# Patient Record
Sex: Male | Born: 1960 | Race: Black or African American | Hispanic: No | Marital: Married | Smoking: Never smoker
Health system: Southern US, Community
[De-identification: ages and names within clinical notes are randomized; demographics above are authoritative.]

## PROBLEM LIST (undated history)

## (undated) HISTORY — PX: CARPAL TUNNEL RELEASE: SHX101

## (undated) HISTORY — PX: TENDON REPAIR: SHX5111

---

## 2004-12-14 ENCOUNTER — Encounter: Admission: RE | Admit: 2004-12-14 | Discharge: 2004-12-14 | Payer: Self-pay | Admitting: Emergency Medicine

## 2004-12-20 ENCOUNTER — Ambulatory Visit (HOSPITAL_BASED_OUTPATIENT_CLINIC_OR_DEPARTMENT_OTHER): Admission: RE | Admit: 2004-12-20 | Discharge: 2004-12-20 | Payer: Self-pay | Admitting: Orthopedic Surgery

## 2008-02-25 ENCOUNTER — Ambulatory Visit (HOSPITAL_BASED_OUTPATIENT_CLINIC_OR_DEPARTMENT_OTHER): Admission: RE | Admit: 2008-02-25 | Discharge: 2008-02-25 | Payer: Self-pay | Admitting: Emergency Medicine

## 2008-03-01 ENCOUNTER — Ambulatory Visit: Payer: Self-pay | Admitting: Internal Medicine

## 2008-09-16 ENCOUNTER — Emergency Department (HOSPITAL_COMMUNITY): Admission: EM | Admit: 2008-09-16 | Discharge: 2008-09-16 | Payer: Self-pay | Admitting: Emergency Medicine

## 2009-03-09 ENCOUNTER — Encounter: Payer: Self-pay | Admitting: *Deleted

## 2010-12-06 LAB — COMPREHENSIVE METABOLIC PANEL
Albumin: 3.6 g/dL (ref 3.5–5.2)
Alkaline Phosphatase: 39 U/L (ref 39–117)
Calcium: 9.1 mg/dL (ref 8.4–10.5)
Chloride: 105 mEq/L (ref 96–112)
GFR calc Af Amer: >60 mL/min (ref >60)
GFR calc non Af Amer: >60 mL/min (ref >60)
Potassium: 3.4 mEq/L — ABNORMAL LOW (ref 3.5–5.1)
Sodium: 139 mEq/L (ref 135–145)
Total Protein: 6.3 g/dL (ref 6.0–8.3)

## 2010-12-06 LAB — CBC
HCT: 40.8 % (ref 39.0–52.0)
MCV: 84.6 fL (ref 78.0–100.0)

## 2010-12-06 LAB — D-DIMER, QUANTITATIVE: D-Dimer, Quant: <0.22 ug/mL-FEU (ref 0.00–0.48)

## 2010-12-06 LAB — POCT CARDIAC MARKERS
Myoglobin, poc: 103 ng/mL (ref 12–200)
Troponin i, poc: <0.05 ng/mL (ref 0.00–0.09)

## 2010-12-06 LAB — DIFFERENTIAL
Lymphs Abs: 1.7 10*3/uL (ref 0.7–4.0)
Monocytes Relative: 13 % — ABNORMAL HIGH (ref 3–12)
Neutro Abs: 2.4 10*3/uL (ref 1.7–7.7)

## 2011-01-04 NOTE — Procedures (Signed)
Juan Golden, Juan Golden                  ACCOUNT NO.:  [id number]   MEDICAL RECORD NO.:  [id number]          PATIENT TYPE:  OUT   LOCATION:  SLEEP CENTER                 FACILITY:  [facility]   PHYSICIAN:  Clinton D. Maple Hudson, MD, FCCP, FACPDATE OF BIRTH:  1960[birth year]04/25   DATE OF STUDY:  02/25/2008                            NOCTURNAL POLYSOMNOGRAM   REFERRING PHYSICIAN:  Reuben Likes, M.D.   INDICATION FOR STUDY:  Hypersomnia with sleep apnea.   EPWORTH SLEEPINESS SCORE:  13/24.  BMI 30, weight 212 pounds, height 71  inches, neck 16 inches.   MEDICATIONS:  None listed.   SLEEP ARCHITECTURE:  Total sleep time 328.5 minutes with sleep  deficiency 93.2%.  Stage I was 4.4%, stage II 76.4%, stage III absent,  REM 19.2% of total sleep time.  Sleep latency 11 minutes, REM latency 55  minutes, awake after sleep onset 14 minutes, arousal index 10.8.  No  bedtime medication taken.  Sleep architecture was unremarkable.   RESPIRATORY DATA:  Apnea/hypopnea index (AHI), 2.7 per hour.  Respiratory disturbance index (RDI), 7.1 per hour.  Fifteen events were  scored, including 1 obstructive apnea and 14 hypopneas.  Events were not  positional.  REM AHI 8.6.  There were insufficient events to permit CPAP  titration by split protocol on this study night.   OXYGEN DATA:  Mild to moderate snoring with oxygen desaturation to a  nadir of 91%.  Mean oxygen saturation through the study was 96.2% on  room air.   CARDIAC DATA:  Normal sinus rhythm.   MOVEMENT-PARASOMNIA:  No limb jerks disturbing sleep.  Bathroom x1.   IMPRESSIONS-RECOMMENDATIONS:  1. Unremarkable sleep architecture with total sleep time of 328      minutes.  Note, that he considered this a poor sleep night.  Sleep      latency was 11 minutes.  He estimated it took him an hour to fall      asleep.  He considered sleep restless, worse than usual and      estimated that he slept only 2 hours.  Total sleep time by EEG was      328.5 minutes,  indicating that there is some sleep state      misperception on his part and reassurance may be appropriate.  2. Occasional respiratory events disturbing sleep.  Minimal criteria      for sleep apnea syndrome based on RDI of 7.1 per hour.  AHI was      within normal range of 2.7 per hour, (normal range 0-5 per hour).      Mild to moderate snoring with nonpositional events and oxygen      desaturation to a nadir of 91%.  3. Scores in this range would not usually be addressed with CPAP      therapy unless clinical circumstances indicate otherwise.  It may      help to lose some weight, to treat for upper airway obstruction or      congestion if significant.      Clinton D. Maple Hudson, MD, FCCP, FACP  Diplomate, Biomedical engineer of Sleep Medicine  Electronically Signed  CDY/MEDQ  D:  03/01/2008 08:48:22  T:  03/01/2008 09:08:02  Job:  [id number]

## 2011-01-07 NOTE — Op Note (Signed)
NAMEALTIN, SEASE                  ACCOUNT NO.:  [id number]   MEDICAL RECORD NO.:  [id number]          PATIENT TYPE:  AMB   LOCATION:  DSC                          FACILITY:  [facility]   PHYSICIAN:  Feliberto Gottron. Turner Daniels, M.D.   DATE OF BIRTH:  [birth year]   DATE OF PROCEDURE:  12/20/2004  DATE OF DISCHARGE:                                 OPERATIVE REPORT   PREOPERATIVE DIAGNOSIS:  Right distal biceps avulsion.   POSTOPERATIVE DIAGNOSIS:  Right distal biceps avulsion.   PROCEDURE:  Right distal biceps repair using an 8 x 15 mm Arthrex bio-  tenodesis screw.   SURGEON:  Feliberto Gottron. Turner Daniels, M.D.   FIRST ASSISTANT:  Erskine Squibb B. Jannet Mantis.   ANESTHETIC:  General endotracheal.   ESTIMATED BLOOD LOSS:  Minimal.   FLUID REPLACEMENT:  800 mL of crystalloid.   DRAINS PLACED:  None.   TOURNIQUET TIME:  45 minutes.   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  The risks and benefits of surgery well-  understood by the patient.   DESCRIPTION OF PROCEDURE:  The patient identified by armband, taken the  operating room at [facility] day surgery center.  Appropriate anesthetic monitors  were attached, general endotracheal anesthesia induced with the patient in  the supine position.  A tourniquet was applied high the right upper  extremity, which was then prepped and draped in the usual sterile fashion  from the fingertips to the tourniquet.  We then made a hockey stick-shaped  incision starting out just distal to the elbow flexion crease, just to the  medial of midline, and then going laterally about a 1.5 cm lateral to the  midline and going distally for about 3 cm through the skin and subcutaneous  tissue.  Using manual palpation, we were then able to reach up underneath  the skin proximally  and identify and retrieve the biceps tendon, which had  been avulsed off.  We trimmed the ends and then using a #2 FiberWire we made  whipstitch up and down the tendon and wrapped it with a moist towel.  We  then manually explored the tract of the biceps tendon back down to the  bicipital tuberosity and using baby Hohmann and cobra retractors were able  to expose the bicipital tuberosity with the hand in full supination.  With  the elbow flexed 30 degrees, we then used the Arthrex guidewire into the  biceps tuberosity and overreamed it with the 8 mm reamer to a depth the 15  mm, not violating the distal cortex.  We then used the whipstitch, going  through the tendon, and brought it up through the bio-screw screwdriver,  allowing [place] to bring the tendon past the bio-screw as we inserted it into  the socket.  We then screwed the bio-screw down, getting good compression of  the tendon against the wall of  the socket and then the using the two limbs  of the #2 FiberWire tied it over the screw, which was fully seated, for  supplemental fixation.  Once this had been accomplished, the biceps tendon  was noted be firmly fixed to the bone.  The hand could be supinated and  pronated without difficulty.  He could even use the tendon to flex the elbow  against gravity because of the firmness of the fixation.  At this point the  tourniquet was let down and the wound was irrigated out with normal saline  solution.  Small bleeders were identified and cauterized.  The subcutaneous tissue was  closed with running 3-0 Vicryl suture and the skin with running interlocking  4-0 nylon suture.  A dressing of Xeroform, 4x4 dressing sponges, Webril and  an Ace wrap applied.  The patient was then placed in a sling, awakened and  taken to the recovery room without difficulty.      FJR/MEDQ  D:  12/20/2004  T:  12/20/2004  Job:  [id number]

## 2011-12-08 ENCOUNTER — Encounter: Payer: Self-pay | Admitting: *Deleted

## 2013-05-29 ENCOUNTER — Encounter: Payer: Self-pay | Admitting: *Deleted

## 2013-05-29 ENCOUNTER — Encounter: Payer: Self-pay | Admitting: Cardiovascular Disease

## 2013-11-17 ENCOUNTER — Other Ambulatory Visit: Payer: Self-pay | Admitting: *Deleted

## 2013-11-17 ENCOUNTER — Ambulatory Visit: Payer: BC Managed Care – PPO

## 2013-11-17 ENCOUNTER — Ambulatory Visit (INDEPENDENT_AMBULATORY_CARE_PROVIDER_SITE_OTHER): Payer: BC Managed Care – PPO | Admitting: Emergency Medicine

## 2013-11-17 ENCOUNTER — Ambulatory Visit (HOSPITAL_COMMUNITY)
Admission: RE | Admit: 2013-11-17 | Discharge: 2013-11-17 | Disposition: A | Discharge status: Home / Self Care | Payer: BC Managed Care – PPO | Source: Ambulatory Visit | Attending: Emergency Medicine | Admitting: Emergency Medicine

## 2013-11-17 VITALS — BP 108/78 | HR 66 | Temp 97.8°F | Resp 16 | Ht 71.0 in | Wt 209.0 lb

## 2013-11-17 DIAGNOSIS — R109 Unspecified abdominal pain: Secondary | ICD-10-CM

## 2013-11-17 DIAGNOSIS — R110 Nausea: Secondary | ICD-10-CM

## 2013-11-17 DIAGNOSIS — R1110 Vomiting, unspecified: Secondary | ICD-10-CM | POA: Insufficient documentation

## 2013-11-17 DIAGNOSIS — K7689 Other specified diseases of liver: Secondary | ICD-10-CM | POA: Insufficient documentation

## 2013-11-17 DIAGNOSIS — R1012 Left upper quadrant pain: Secondary | ICD-10-CM | POA: Insufficient documentation

## 2013-11-17 LAB — POCT CBC
Granulocyte percent: 71.5 %G (ref 37–80)
HCT, POC: 44.8 % (ref 43.5–53.7)
HEMOGLOBIN: 14.9 g/dL (ref 14.1–18.1)
Lymph, poc: 1.3 (ref 0.6–3.4)
MCH: 27.6 pg (ref 27–31.2)
MCHC: 33.3 g/dL (ref 31.8–35.4)
MCV: 83 fL (ref 80–97)
MID (CBC): 0.4 (ref 0–0.9)
MPV: 9.5 fL (ref 0–99.8)
PLATELET COUNT, POC: 230 10*3/uL (ref 142–424)
POC Granulocyte: 4.1 (ref 2–6.9)
POC LYMPH PERCENT: 21.6 %L (ref 10–50)
POC MID %: 6.9 %M (ref 0–12)
RBC: 5.4 M/uL (ref 4.69–6.13)
RDW, POC: 14.7 %
WBC: 5.8 10*3/uL (ref 4.6–10.2)

## 2013-11-17 LAB — POCT URINALYSIS DIPSTICK
BILIRUBIN UA: NEGATIVE
GLUCOSE UA: NEGATIVE
Ketones, UA: NEGATIVE
LEUKOCYTES UA: NEGATIVE
Nitrite, UA: NEGATIVE
Protein, UA: NEGATIVE
Spec Grav, UA: 1.02
UROBILINOGEN UA: 0.2
pH, UA: 5.5

## 2013-11-17 LAB — COMPREHENSIVE METABOLIC PANEL
ALBUMIN: 4.2 g/dL (ref 3.5–5.2)
ALT: 19 U/L (ref 0–53)
AST: 22 U/L (ref 0–37)
Alkaline Phosphatase: 52 U/L (ref 39–117)
BUN: 14 mg/dL (ref 6–23)
CALCIUM: 9.2 mg/dL (ref 8.4–10.5)
CHLORIDE: 106 meq/L (ref 96–112)
CO2: 23 meq/L (ref 19–32)
Creat: 1.19 mg/dL (ref 0.50–1.35)
GLUCOSE: 78 mg/dL (ref 70–99)
Potassium: 4.2 mEq/L (ref 3.5–5.3)
Sodium: 140 mEq/L (ref 135–145)
TOTAL PROTEIN: 6.5 g/dL (ref 6.0–8.3)
Total Bilirubin: 0.8 mg/dL (ref 0.2–1.2)

## 2013-11-17 LAB — IFOBT (OCCULT BLOOD): IMMUNOLOGICAL FECAL OCCULT BLOOD TEST: NEGATIVE

## 2013-11-17 LAB — AMYLASE: Amylase: 42 U/L (ref 0–105)

## 2013-11-17 LAB — POCT UA - MICROSCOPIC ONLY
Bacteria, U Microscopic: NEGATIVE
CASTS, UR, LPF, POC: NEGATIVE
Crystals, Ur, HPF, POC: NEGATIVE
EPITHELIAL CELLS, URINE PER MICROSCOPY: NEGATIVE
YEAST UA: NEGATIVE

## 2013-11-17 MED ORDER — IOHEXOL 300 MG/ML  SOLN
100.0000 mL | Freq: Once | INTRAMUSCULAR | Status: AC | PRN
  Administered 2013-11-17: 100 mL via INTRAVENOUS

## 2013-11-17 MED ORDER — CIPROFLOXACIN HCL 500 MG PO TABS: 500.0000 mg | ORAL_TABLET | Freq: Two times a day (BID) | ORAL | Status: DC

## 2013-11-17 MED ORDER — IOHEXOL 300 MG/ML  SOLN
50.0000 mL | Freq: Once | INTRAMUSCULAR | Status: AC | PRN
  Administered 2013-11-17: 50 mL via ORAL

## 2013-11-17 NOTE — Progress Notes (Addendum)
Subjective:  This chart was scribed for Juan ChrisSteven Young Mulvey, MD by Carl Bestelina Holson, Medical Scribe. This patient was seen in Room 8 and the patient's care was started at 9:09 AM.   Patient ID: Juan KayEarl J Streat Jr., male    DOB: [birth year], 53 y.o.   MRN: [id number]  HPI HPI Comments: Juan Kayarl J Hintze Jr. is a 53 y.o. male who [facility] complaining of intermittent left upper sided abdominal pain that started at 5 PM yesterday.  The patient states that the pain would last for 15 minutes and go away.  He states that he had about 4 episodes of this abdominal pain last night and each time it got worse.  He states that he vomited during the second episode of this abdominal pain.  He states that his last episode of pain was at 8 -9 PM last night.  The patient states that last night his pain was at a 15/10 and felt as though he had been hit in the testicles.  He states that he rates the pain at a 3-4/10 currently.  He denies constipation, groin pain, testicular pain, and back pain as associated symptoms.  He states that he urinated regularly early this morning and had a small bowel movement.  He states that he went to [facility] yesterday but left without being seen because he was starting to feel better.  He states that today the pain is not as bad but is still noticeable.  He states that he has not eaten anything but states that he is hungry.  He denies having a history of kidney stones.  He states that his last colonoscopy was in January of 2014.  He denies having any major health problems.    Review of Systems  Gastrointestinal: Positive for vomiting and abdominal pain. Negative for constipation.  Genitourinary: Negative for testicular pain.  Musculoskeletal: Negative for back pain.  All other systems reviewed and are negative.     Objective:  Physical Exam Physical Exam  Nursing note and vitals reviewed. Constitutional: He is oriented to person, place, and time.  He appears well-developed and well-nourished.  HENT:  Head: Normocephalic and atraumatic.  Eyes: EOM are normal.  Neck: Normal range of motion.  Cardiovascular: Normal rate.   Pulmonary/Chest: Effort normal.  Musculoskeletal: Normal range of motion.  Neurological: He is alert and oriented to person, place, and time.  Skin: Skin is warm and dry.  Psychiatric: He has a normal mood and affect. His behavior is normal.  ABD bowel sounds are normal. I could not elicit any abdominal tenderness on examination. His abdomen is not distended. There is not a palpable hernia. His testicles are normal. Prostate is normal size there is stool in the rectal vault.     Results for orders placed in visit on 11/17/13  POCT CBC      Result Value Ref Range   WBC 5.8  4.6 - 10.2 K/uL   Lymph, poc 1.3  0.6 - 3.4   POC LYMPH PERCENT 21.6  10 - 50 %L   MID (cbc) 0.4  0 - 0.9   POC MID % 6.9  0 - 12 %M   POC Granulocyte 4.1  2 - 6.9   Granulocyte percent 71.5  37 - 80 %G   RBC 5.40  4.69 - 6.13 M/uL   Hemoglobin 14.9  14.1 - 18.1 g/dL   HCT, POC 86.544.8  78.443.5 - 53.7 %   MCV  83.0  80 - 97 fL   MCH, POC 27.6  27 - 31.2 pg   MCHC 33.3  31.8 - 35.4 g/dL   RDW, POC 16.1     Platelet Count, POC 230  142 - 424 K/uL   MPV 9.5  0 - 99.8 fL   [facility] reading (PRIMARY) by  Dr. Cleta Alberts there is a significant amount of stool in the colon. There is no free air there is no evidence of obstruction Results for orders placed in visit on 11/17/13  POCT CBC      Result Value Ref Range   WBC 5.8  4.6 - 10.2 K/uL   Lymph, poc 1.3  0.6 - 3.4   POC LYMPH PERCENT 21.6  10 - 50 %L   MID (cbc) 0.4  0 - 0.9   POC MID % 6.9  0 - 12 %M   POC Granulocyte 4.1  2 - 6.9   Granulocyte percent 71.5  37 - 80 %G   RBC 5.40  4.69 - 6.13 M/uL   Hemoglobin 14.9  14.1 - 18.1 g/dL   HCT, POC 09.6  04.5 - 53.7 %   MCV 83.0  80 - 97 fL   MCH, POC 27.6  27 - 31.2 pg   MCHC 33.3  31.8 - 35.4 g/dL   RDW, POC 40.9     Platelet Count, POC 230  142 -  424 K/uL   MPV 9.5  0 - 99.8 fL  POCT URINALYSIS DIPSTICK      Result Value Ref Range   Color, UA dk yellow     Clarity, UA clear     Glucose, UA neg     Bilirubin, UA neg     Ketones, UA neg     Spec Grav, UA 1.020     Blood, UA trace-lysed     pH, UA 5.5     Protein, UA neg     Urobilinogen, UA 0.2     Nitrite, UA neg     Leukocytes, UA Negative    POCT UA - MICROSCOPIC ONLY      Result Value Ref Range   WBC, Ur, HPF, POC 0-1     RBC, urine, microscopic 0-2     Bacteria, U Microscopic neg     Mucus, UA trace     Epithelial cells, urine per micros neg     Crystals, Ur, HPF, POC neg     Casts, Ur, LPF, POC neg     Yeast, UA neg    IFOBT (OCCULT BLOOD)      Result Value Ref Range   IFOBT Negative      BP 108/78  Pulse 66  Temp(Src) 97.8 F (36.6 C)  Resp 16  Ht 5\' 11"  (1.803 m)  Wt 209 lb (94.802 kg)  BMI 29.16 kg/m2  SpO2 98% Assessment & Plan:  This may all be constipation related. Unclear at the present time. Last night he said his pain level was 15 out of 10. We'll proceed with a CT abdomen and pelvis to be sure we are not missing anything. CT shows a probable hemangioma in the liver as well as inflammation of the sigmoid colon. This was felt to possibly represent a colitis. We'll treat with Cipro 500 twice a day for 10 days. Will await liver function tests.  I personally performed the services described in this documentation, which was scribed in my presence. The recorded information has been reviewed and is accurate.

## 2013-11-17 NOTE — Patient Instructions (Signed)
You are to go to [facility] and register as an outpatient for your CT scan

## 2013-11-18 ENCOUNTER — Telehealth: Payer: Self-pay

## 2013-11-18 NOTE — Telephone Encounter (Signed)
Pt notified. See labs 

## 2013-11-18 NOTE — Telephone Encounter (Signed)
Patient is returning our phone call regarding his results.   Best#: [phone]

## 2014-03-19 ENCOUNTER — Telehealth: Payer: Self-pay

## 2014-03-19 NOTE — Telephone Encounter (Signed)
Juan Golden from [facility] left message on Tuesday at 2:27pm asking for a call back regarding patient. Did not leave any details. Returned call today and left message at [phone].

## 2015-02-05 ENCOUNTER — Telehealth: Payer: Self-pay | Admitting: Internal Medicine

## 2015-02-05 NOTE — Telephone Encounter (Signed)
Received records from [facility] for appointment with Dr Rennis Golden on 03/31/15.  Records given to [facility] (medical records) for Dr [facility] schedule on 03/31/15. lp

## 2015-03-31 ENCOUNTER — Ambulatory Visit: Payer: BC Managed Care – PPO | Admitting: Internal Medicine

## 2019-04-01 ENCOUNTER — Encounter (HOSPITAL_COMMUNITY): Payer: Self-pay | Admitting: Emergency Medicine

## 2019-04-01 ENCOUNTER — Ambulatory Visit (HOSPITAL_COMMUNITY)
Admission: EM | Admit: 2019-04-01 | Discharge: 2019-04-01 | Disposition: A | Discharge status: Home / Self Care | Payer: BC Managed Care – PPO | Attending: Family Medicine | Admitting: Family Medicine

## 2019-04-01 ENCOUNTER — Other Ambulatory Visit: Payer: Self-pay

## 2019-04-01 DIAGNOSIS — R079 Chest pain, unspecified: Secondary | ICD-10-CM

## 2019-04-01 NOTE — ED Provider Notes (Signed)
[facility]    CSN: [id number] Arrival date & time: 04/01/19  1631      History   Chief Complaint Chief Complaint  Patient presents with  . Chest Pain    HPI Juan Kayarl J Warchol Jr. is a 58 y.o. male.   HPI  Patient is here for chest pain.  Is been bothering for the last 3 nights.  He does not have chest pain during the day.  It is always in the middle of the night he will wake up with a pressure sensation in the middle of his chest.  He states when he has this he feels like he cannot take a deep breath.  He has had chest pain in the past and had a stress test about 5 years ago.  He is never had any heart disease.  He does not have hypertension.  He is on atorvastatin for Lipitor.  There is no family history of heart disease.  He does not have diabetes, cigarette smoking, obesity, or sedentary lifestyle.  He does have a history of GERD.  This feels different than his GERD.  It is not related to meals.  He states he is undergoing some stress right now.  He did not have any trauma or chest wall pain.  No history of underlying lung disease The chest pain does not cause any dizziness been it.  No nausea or vomiting.  No radiation of pain.  No palpitations.  He states it does make him moderately anxious  Past Medical History:  Diagnosis Date  . Chest pain     There are no active problems to display for this patient.   Past Surgical History:  Procedure Laterality Date  . CARPAL TUNNEL RELEASE     left  . TENDON REPAIR     right arm       Home Medications    Prior to Admission medications   Medication Sig Start Date End Date Taking? Authorizing Provider  atorvastatin (LIPITOR) 20 MG tablet Take 20 mg by mouth daily.   Yes [provider]  Ibuprofen (ADVIL) 200 MG CAPS Take by mouth as needed.    [provider]    Family History Family History  Problem Relation Age of Onset  . Hypertension Mother   . Hypertension Father   . Diabetes Father   .  Dementia Other   . Diabetes Other     Social History Social History   Tobacco Use  . Smoking status: Never Smoker  Substance Use Topics  . Alcohol use: No  . Drug use: Not on file     Allergies   Patient has no known allergies.   Review of Systems Review of Systems  Constitutional: Negative for chills and fever.  HENT: Negative for ear pain and sore throat.   Eyes: Negative for pain and visual disturbance.  Respiratory: Negative for cough and shortness of breath.   Cardiovascular: Positive for chest pain. Negative for palpitations.  Gastrointestinal: Negative for abdominal pain and vomiting.  Genitourinary: Negative for dysuria and hematuria.  Musculoskeletal: Negative for arthralgias and back pain.  Skin: Negative for color change and rash.  Neurological: Negative for seizures and syncope.  All other systems reviewed and are negative.    Physical Exam Triage Vital Signs ED Triage Vitals  Enc Vitals Group     BP 04/01/19 1723 (!) 135/93     Pulse Rate 04/01/19 1723 65     Resp --  Temp 04/01/19 1723 98.5 F (36.9 C)     Temp Source 04/01/19 1723 Oral     SpO2 04/01/19 1723 99 %     Weight --      Height --      Head Circumference --      Peak Flow --      Pain Score 04/01/19 1718 4     Pain Loc --      Pain Edu? --      Excl. in [place]? --    No data found.  Updated Vital Signs BP (!) 135/93 (BP Location: Left Arm)   Pulse 65   Temp 98.5 F (36.9 C) (Oral)   SpO2 99%      Physical Exam Constitutional:      General: He is not in acute distress.    Appearance: He is well-developed.  HENT:     Head: Normocephalic and atraumatic.  Eyes:     Conjunctiva/sclera: Conjunctivae normal.     Pupils: Pupils are equal, round, and reactive to light.  Neck:     Musculoskeletal: Normal range of motion and neck supple.  Cardiovascular:     Rate and Rhythm: Normal rate and regular rhythm.     Heart sounds: Normal heart sounds. No murmur.  Pulmonary:      Effort: Pulmonary effort is normal. No respiratory distress.     Breath sounds: Normal breath sounds.  Abdominal:     General: Bowel sounds are normal. There is no distension.     Palpations: Abdomen is soft.     Tenderness: There is no abdominal tenderness.  Musculoskeletal: Normal range of motion.     Right lower leg: No edema.     Left lower leg: No edema.  Skin:    General: Skin is warm and dry.  Neurological:     General: No focal deficit present.     Mental Status: He is alert.  Psychiatric:        Mood and Affect: Mood normal.        Behavior: Behavior normal.      UC Treatments / Results  Labs (all labs ordered are listed, but only abnormal results are displayed) Labs Reviewed - No data to display  EKG   Radiology No results found.  Procedures Procedures (including critical care time)  Medications Ordered in UC Medications - No data to display  Initial Impression / Assessment and Plan / UC Course  I have reviewed the triage vital signs and the nursing notes.  Pertinent labs & imaging results that were available during my care of the patient were reviewed by me and considered in my medical decision making (see chart for details).     Reviewed causes of chest pain.  Reviewed the typical cardiac chest pain.  I told him that his chest pain is not type of cough or cardiac: And his EKG is normal.  I believe he has stress related chest pain.  In any event I do want him to follow-up with his family doctor, and probably have a cardiology consultation if his chest pain persists.  I encouraged him to take omeprazole since night pain can be acid reflux. Final Clinical Impressions(s) / UC Diagnoses   Final diagnoses:  Chest pain, unspecified type     Discharge Instructions     Consider acid reflux.  Take omeprazole once a day for 2 weeks Stress may be contributing to your pain.  Take deep breaths. Try to eat well, get plenty  of exercise, get plenty of sleep  Follow-up with your primary care doctor   ED Prescriptions    None     Controlled Substance Prescriptions [place] Controlled Substance Registry consulted? Not Applicable   Eustace MooreNelson, Mayre Bury Sue, MD 04/01/19 2027

## 2019-04-01 NOTE — Discharge Instructions (Addendum)
Consider acid reflux.  Take omeprazole once a day for 2 weeks Stress may be contributing to your pain.  Take deep breaths. Try to eat well, get plenty of exercise, get plenty of sleep Follow-up with your primary care doctor

## 2019-04-01 NOTE — ED Triage Notes (Signed)
Patient has a history of chest pain-has been evaluated and heart cleared.    However, over the last 3 nights has had pressure in left chest.   Has talked to chiropractor that encouraged him to get chest pain evaluated.    This episode of chest pain is pressure and this is different from pain in the past.  Pain worse at night.

## 2019-04-03 ENCOUNTER — Encounter (HOSPITAL_COMMUNITY): Payer: Self-pay

## 2019-04-03 ENCOUNTER — Other Ambulatory Visit: Payer: Self-pay

## 2019-04-03 ENCOUNTER — Emergency Department (HOSPITAL_COMMUNITY)
Admission: EM | Admit: 2019-04-03 | Discharge: 2019-04-04 | Disposition: A | Discharge status: Home / Self Care | Payer: BC Managed Care – PPO | Attending: Emergency Medicine | Admitting: Emergency Medicine

## 2019-04-03 ENCOUNTER — Ambulatory Visit (INDEPENDENT_AMBULATORY_CARE_PROVIDER_SITE_OTHER)
Admission: EM | Admit: 2019-04-03 | Discharge: 2019-04-03 | Disposition: A | Discharge status: Other Acute Inpatient Hospital | Payer: BC Managed Care – PPO | Source: Home / Self Care | Attending: Family Medicine | Admitting: Family Medicine

## 2019-04-03 ENCOUNTER — Emergency Department (HOSPITAL_COMMUNITY): Payer: BC Managed Care – PPO

## 2019-04-03 DIAGNOSIS — N200 Calculus of kidney: Secondary | ICD-10-CM

## 2019-04-03 DIAGNOSIS — R1084 Generalized abdominal pain: Secondary | ICD-10-CM

## 2019-04-03 DIAGNOSIS — Z79899 Other long term (current) drug therapy: Secondary | ICD-10-CM | POA: Insufficient documentation

## 2019-04-03 DIAGNOSIS — R112 Nausea with vomiting, unspecified: Secondary | ICD-10-CM | POA: Insufficient documentation

## 2019-04-03 DIAGNOSIS — N23 Unspecified renal colic: Secondary | ICD-10-CM

## 2019-04-03 DIAGNOSIS — N179 Acute kidney failure, unspecified: Secondary | ICD-10-CM

## 2019-04-03 DIAGNOSIS — N132 Hydronephrosis with renal and ureteral calculous obstruction: Secondary | ICD-10-CM | POA: Insufficient documentation

## 2019-04-03 DIAGNOSIS — R1032 Left lower quadrant pain: Secondary | ICD-10-CM | POA: Diagnosis present

## 2019-04-03 LAB — URINALYSIS, ROUTINE W REFLEX MICROSCOPIC
Bacteria, UA: NONE SEEN
Bilirubin Urine: NEGATIVE
Glucose, UA: NEGATIVE mg/dL
Hgb urine dipstick: NEGATIVE
Ketones, ur: NEGATIVE mg/dL
Nitrite: NEGATIVE
Protein, ur: NEGATIVE mg/dL
Specific Gravity, Urine: 1.028 (ref 1.005–1.030)
pH: 5 (ref 5.0–8.0)

## 2019-04-03 LAB — COMPREHENSIVE METABOLIC PANEL
ALT: 25 U/L (ref 0–44)
AST: 43 U/L — ABNORMAL HIGH (ref 15–41)
Albumin: 4.3 g/dL (ref 3.5–5.0)
Alkaline Phosphatase: 49 U/L (ref 38–126)
Anion gap: 12 (ref 5–15)
BUN: 15 mg/dL (ref 6–20)
CO2: 23 mmol/L (ref 22–32)
Calcium: 9.1 mg/dL (ref 8.9–10.3)
Chloride: 105 mmol/L (ref 98–111)
Creatinine, Ser: 1.78 mg/dL — ABNORMAL HIGH (ref 0.61–1.24)
GFR calc Af Amer: 47 mL/min — ABNORMAL LOW (ref >60)
GFR calc non Af Amer: 41 mL/min — ABNORMAL LOW (ref >60)
Glucose, Bld: 130 mg/dL — ABNORMAL HIGH (ref 70–99)
Potassium: 3.3 mmol/L — ABNORMAL LOW (ref 3.5–5.1)
Sodium: 140 mmol/L (ref 135–145)
Total Bilirubin: 0.6 mg/dL (ref 0.3–1.2)
Total Protein: 6.8 g/dL (ref 6.5–8.1)

## 2019-04-03 LAB — CBC
HCT: 39.4 % (ref 39.0–52.0)
Hemoglobin: 14.2 g/dL (ref 13.0–17.0)
MCH: 28 pg (ref 26.0–34.0)
MCHC: 36 g/dL (ref 30.0–36.0)
MCV: 77.7 fL — ABNORMAL LOW (ref 80.0–100.0)
Platelets: 232 10*3/uL (ref 150–400)
RBC: 5.07 MIL/uL (ref 4.22–5.81)
RDW: 13.2 % (ref 11.5–15.5)
WBC: 7.2 10*3/uL (ref 4.0–10.5)
nRBC: 0 % (ref 0.0–0.2)

## 2019-04-03 LAB — LIPASE, BLOOD: Lipase: 28 U/L (ref 11–51)

## 2019-04-03 MED ORDER — ONDANSETRON HCL 4 MG/2ML IJ SOLN
4.0000 mg | Freq: Once | INTRAMUSCULAR | Status: AC
  Administered 2019-04-03: 4 mg via INTRAVENOUS
  Filled 2019-04-03: qty 2

## 2019-04-03 MED ORDER — FENTANYL CITRATE (PF) 100 MCG/2ML IJ SOLN
50.0000 ug | INTRAMUSCULAR | Status: DC | PRN
  Administered 2019-04-03: 50 ug via INTRAVENOUS
  Filled 2019-04-03: qty 2

## 2019-04-03 MED ORDER — IOHEXOL 300 MG/ML  SOLN
100.0000 mL | Freq: Once | INTRAMUSCULAR | Status: AC | PRN
  Administered 2019-04-03: 100 mL via INTRAVENOUS

## 2019-04-03 NOTE — ED Triage Notes (Signed)
Pt states he he ate some grill chicken and he is have pain x 10 in his abdominal area . Pt states he has been vomiting. This has been going on for 45 minutes.

## 2019-04-03 NOTE — ED Notes (Signed)
Patient is being discharged from the [facility] and sent to the Emergency Department via POV. Per [facility], patient is stable but in need of higher level of care due to further eval abd pain. Patient is aware and verbalizes understanding of plan of care.  Vitals:   04/03/19 2011  BP: 129/77  Pulse: 67  Resp: 18  Temp: 98.6 F (37 C)  SpO2: 100%

## 2019-04-03 NOTE — ED Triage Notes (Signed)
Pt arrives POV for eval of acute onset abd pain 1 hour PTA. Pt is markedly uncomfortable in triage, unable to sit still. Reports hx of same pain last year, but resoleved. Denies hx of abd surg

## 2019-04-04 MED ORDER — ONDANSETRON HCL 4 MG/2ML IJ SOLN
4.0000 mg | Freq: Once | INTRAMUSCULAR | Status: AC
  Administered 2019-04-04: 02:00:00 4 mg via INTRAVENOUS
  Filled 2019-04-04: qty 2

## 2019-04-04 MED ORDER — KETOROLAC TROMETHAMINE 30 MG/ML IJ SOLN
15.0000 mg | Freq: Once | INTRAMUSCULAR | Status: AC
  Administered 2019-04-04: 15 mg via INTRAVENOUS
  Filled 2019-04-04: qty 1

## 2019-04-04 MED ORDER — HYDROMORPHONE HCL 1 MG/ML IJ SOLN
1.0000 mg | Freq: Once | INTRAMUSCULAR | Status: AC
  Administered 2019-04-04: 1 mg via INTRAVENOUS
  Filled 2019-04-04: qty 1

## 2019-04-04 MED ORDER — OXYCODONE-ACETAMINOPHEN 5-325 MG PO TABS
2.0000 | ORAL_TABLET | Freq: Once | ORAL | Status: AC
  Administered 2019-04-04: 2 via ORAL
  Filled 2019-04-04: qty 2

## 2019-04-04 MED ORDER — OXYCODONE-ACETAMINOPHEN 5-325 MG PO TABS: 1.0000 | ORAL_TABLET | Freq: Four times a day (QID) | ORAL | 0 refills | Status: AC | PRN

## 2019-04-04 MED ORDER — ONDANSETRON 4 MG PO TBDP: 4.0000 mg | ORAL_TABLET | Freq: Three times a day (TID) | ORAL | 0 refills | Status: AC | PRN

## 2019-04-04 MED ORDER — TAMSULOSIN HCL 0.4 MG PO CAPS: 0.4000 mg | ORAL_CAPSULE | Freq: Every day | ORAL | 0 refills | Status: AC

## 2019-04-04 NOTE — ED Notes (Signed)
Patient verbalizes understanding of discharge instructions. Opportunity for questioning and answers were provided. Armband removed by staff, pt discharged from ED, exited via wheelchair, to go home via family member.

## 2019-04-04 NOTE — ED Provider Notes (Signed)
[facility] Note   CSN: [id number] Arrival date & time: 04/03/19  2033    History   Chief Complaint Chief Complaint  Patient presents with   Abdominal Pain    HPI Juan Kayarl J Sonier Jr. is a 58 y.o. male.     58 year old male presents to the emergency department for evaluation of sudden onset left-sided abdominal pain which began at approximately 2000 tonight.  Pain has remained constant, waxing and waning in severity.  He has had associated nausea with 3 episodes of vomiting since arrival to the ED.  Denies taking any medications for pain prior to arrival.  He has had no improvement to his discomfort following IV fentanyl given in triage.  Had a normal bowel movement earlier today.  Denies melena, hematochezia, fevers, hematuria, dysuria, difficulty voiding.  No history of abdominal surgeries.  The history is provided by the patient. No language interpreter was used.  Abdominal Pain   Past Medical History:  Diagnosis Date   Chest pain     There are no active problems to display for this patient.   Past Surgical History:  Procedure Laterality Date   CARPAL TUNNEL RELEASE     left   TENDON REPAIR     right arm        Home Medications    Prior to Admission medications   Medication Sig Start Date End Date Taking? Authorizing Provider  atorvastatin (LIPITOR) 20 MG tablet Take 20 mg by mouth daily.    [provider]  Ibuprofen (ADVIL) 200 MG CAPS Take by mouth as needed.    [provider]  ondansetron (ZOFRAN ODT) 4 MG disintegrating tablet Take 1 tablet (4 mg total) by mouth every 8 (eight) hours as needed for nausea or vomiting. 04/04/19   Antony MaduraHumes, Roben Tatsch, PA-C  oxyCODONE-acetaminophen (PERCOCET/ROXICET) 5-325 MG tablet Take 1-2 tablets by mouth every 6 (six) hours as needed for severe pain. 04/04/19   Antony MaduraHumes, Keywon Mestre, PA-C  tamsulosin (FLOMAX) 0.4 MG CAPS capsule Take 1 capsule (0.4 mg total) by mouth daily.  04/04/19   Antony MaduraHumes, Hillery Bhalla, PA-C    Family History Family History  Problem Relation Age of Onset   Hypertension Mother    Hypertension Father    Diabetes Father    Dementia Other    Diabetes Other     Social History Social History   Tobacco Use   Smoking status: Never Smoker  Substance Use Topics   Alcohol use: No   Drug use: Not on file     Allergies   Patient has no known allergies.   Review of Systems Review of Systems  Gastrointestinal: Positive for abdominal pain.   Ten systems reviewed and are negative for acute change, except as noted in the HPI.    Physical Exam Updated Vital Signs BP (!) 125/95    Pulse 62    Temp 98.1 F (36.7 C) (Oral)    Resp 14    SpO2 97%   Physical Exam Vitals signs and nursing note reviewed.  Constitutional:      General: He is not in acute distress.    Appearance: He is well-developed. He is not diaphoretic.     Comments: Patient appears uncomfortable, but nontoxic.  Sporadically belching.  HENT:     Head: Normocephalic and atraumatic.  Eyes:     General: No scleral icterus.    Conjunctiva/sclera: Conjunctivae normal.  Neck:     Musculoskeletal: Normal range of motion.  Cardiovascular:  Rate and Rhythm: Normal rate and regular rhythm.     Pulses: Normal pulses.  Pulmonary:     Effort: Pulmonary effort is normal. No respiratory distress.     Comments: Respirations even and unlabored Abdominal:     Comments: Tenderness to palpation of the left mid abdomen without guarding.  Abdomen is soft without peritoneal signs.  Musculoskeletal: Normal range of motion.  Skin:    General: Skin is warm and dry.     Coloration: Skin is not pale.     Findings: No erythema or rash.  Neurological:     General: No focal deficit present.     Mental Status: He is alert and oriented to person, place, and time.     Coordination: Coordination normal.     Comments: Moving all extremities spontaneously  Psychiatric:        Behavior:  Behavior normal.      ED Treatments / Results  Labs (all labs ordered are listed, but only abnormal results are displayed) Labs Reviewed  COMPREHENSIVE METABOLIC PANEL - Abnormal; Notable for the following components:      Result Value   Potassium 3.3 (*)    Glucose, Bld 130 (*)    Creatinine, Ser 1.78 (*)    AST 43 (*)    GFR calc non Af Amer 41 (*)    GFR calc Af Amer 47 (*)    All other components within normal limits  CBC - Abnormal; Notable for the following components:   MCV 77.7 (*)    All other components within normal limits  URINALYSIS, ROUTINE W REFLEX MICROSCOPIC - Abnormal; Notable for the following components:   APPearance HAZY (*)    Leukocytes,Ua TRACE (*)    All other components within normal limits  LIPASE, BLOOD    EKG None  Radiology Ct Abdomen Pelvis W Contrast  Result Date: 04/03/2019 CLINICAL DATA:  58 year old male with acute abdominal pain, nausea and vomiting. EXAM: CT ABDOMEN AND PELVIS WITH CONTRAST TECHNIQUE: Multidetector CT imaging of the abdomen and pelvis was performed using the standard protocol following bolus administration of intravenous contrast. CONTRAST:  125mL OMNIPAQUE IOHEXOL 300 MG/ML  SOLN COMPARISON:  CT of the abdomen pelvis dated 11/17/2013 FINDINGS: Lower chest: The visualized lung bases are clear. No intra-abdominal free air or free fluid. Hepatobiliary: Subcentimeter left hepatic hypodense foci are too small to characterize but may represent cysts or hemangioma. The liver is otherwise unremarkable. No intrahepatic biliary ductal dilatation. The gallbladder is unremarkable. Pancreas: Unremarkable. No pancreatic ductal dilatation or surrounding inflammatory changes. Spleen: Normal in size without focal abnormality. Adrenals/Urinary Tract: The adrenal glands are unremarkable. There is a 4 mm stone in the distal left ureter with mild left hydronephrosis. There is asymmetric delayed enhancement and excretion of contrast by the left  kidney. There is left perinephric stranding. Correlation with urinalysis recommended to exclude superimposed UTI. The right kidney is unremarkable. The urinary bladder is only partially distended and grossly unremarkable. Stomach/Bowel: Small hiatal hernia. There is no bowel obstruction or active inflammation. The appendix is normal. Vascular/Lymphatic: The abdominal aorta and IVC are unremarkable. No portal venous gas. There is no adenopathy. Reproductive: The prostate and seminal vesicles are grossly unremarkable. No pelvic mass. Other: None Musculoskeletal: No acute or significant osseous findings. IMPRESSION: 1. A 4 mm distal left ureteral stone with mild left hydronephrosis. Correlation with urinalysis recommended to exclude superimposed UTI. 2. No bowel obstruction or active inflammation. Normal appendix. Electronically Signed   By: Laren Everts.D.  On: 04/03/2019 23:19    Procedures Procedures (including critical care time)  Medications Ordered in ED Medications  fentaNYL (SUBLIMAZE) injection 50 mcg (50 mcg Intravenous Given 04/03/19 2124)  oxyCODONE-acetaminophen (PERCOCET/ROXICET) 5-325 MG per tablet 2 tablet (has no administration in time range)  ondansetron (ZOFRAN) injection 4 mg (4 mg Intravenous Given 04/03/19 2124)  iohexol (OMNIPAQUE) 300 MG/ML solution 100 mL (100 mLs Intravenous Contrast Given 04/03/19 2301)  HYDROmorphone (DILAUDID) injection 1 mg (1 mg Intravenous Given 04/04/19 0201)  ketorolac (TORADOL) 30 MG/ML injection 15 mg (15 mg Intravenous Given 04/04/19 0204)  ondansetron (ZOFRAN) injection 4 mg (4 mg Intravenous Given 04/04/19 0207)    2:45 AM Pain has improved down to 4/10 from 10/10. Patient expresses comfort with plan for discharge.   Initial Impression / Assessment and Plan / ED Course  I have reviewed the triage vital signs and the nursing notes.  Pertinent labs & imaging results that were available during my care of the patient were reviewed by me and  considered in my medical decision making (see chart for details).        Patient has been diagnosed with a kidney stone via CT. There is no evidence of significant hydronephrosis, vitals sign stable and the pt does not have irratractable vomiting. Will discharge home with pain medications and Flomax.  He has been encouraged to follow-up with his primary care doctor for recheck of his kidney function.  We will also refer to urology.  Return precautions discussed and provided. Patient discharged in stable condition with no unaddressed concerns.   Final Clinical Impressions(s) / ED Diagnoses   Final diagnoses:  Kidney stone on left side  Ureteral colic  AKI (acute kidney injury) [facility](HCC)    ED Discharge Orders         Ordered    oxyCODONE-acetaminophen (PERCOCET/ROXICET) 5-325 MG tablet  Every 6 hours PRN     04/04/19 0244    tamsulosin (FLOMAX) 0.4 MG CAPS capsule  Daily     04/04/19 0244    ondansetron (ZOFRAN ODT) 4 MG disintegrating tablet  Every 8 hours PRN     04/04/19 0244           Antony MaduraHumes, Zaydrian Batta, PA-C 04/04/19 0247    Derwood KaplanNanavati, Ankit, MD 04/10/19 1514

## 2019-04-04 NOTE — Discharge Instructions (Signed)
Your work-up today was significant for kidney stone.  This has caused a slight elevation of your kidney function which should be rechecked by your primary care doctor.  We anticipate this to resolve when your kidney stone passes.  Take Flomax to try and promote stone movement.  You may use 600 mg ibuprofen every 6 hours for management of pain.  Take Percocet as prescribed for management of severe pain.  Do not drive or drink alcohol after taking this medication as it may make you drowsy and impair your judgment.  Follow-up with urology as needed should symptoms/pain persist.  You may return for any new or concerning symptoms.

## 2019-04-08 NOTE — ED Provider Notes (Addendum)
[facility]   [id number] 04/03/19 Arrival Time: 1952  ASSESSMENT & PLAN:  1. Generalized abdominal pain     Given his apparent level of abdominal pain, sent to ED for further evaluation. Unable to perform thorough workup here. Discussed. Stable upon discharge.   Follow-up Information    Go to  [facility].   Specialty: Emergency Medicine Contact information: [address] [id number] [place] [phone]          Reviewed expectations re: course of current medical issues. Questions answered. Outlined signs and symptoms indicating need for more acute intervention. Patient verbalized understanding. After Visit Summary given.   SUBJECTIVE: History from: patient. Juan Golden. is a 58 y.o. male who reports "severe" abdominal pain. Abrupt onset a few hours ago; persistent; L-sided. With nausea and emesis. No h/o similar. Afebrile. Denies radiation of pain. No change in bowel/bladder habits. No dysuria. No diarrhea/constipation. No specific aggravating or alleviating factors reported. Normal PO intake today until pain started; none since. No appetite. No OTC medications taken.   Past Surgical History:  Procedure Laterality Date  . CARPAL TUNNEL RELEASE     left  . TENDON REPAIR     right arm    ROS: As per HPI. All other systems negative.  OBJECTIVE:  Vitals:   04/03/19 2010 04/03/19 2011  BP:  129/77  Pulse:  67  Resp:  18  Temp:  98.6 F (37 C)  TempSrc:  Oral  SpO2:  100%  Weight: 97.5 kg     General appearance: appears to be in pain; sitting in chair bent forward Lungs: unlabored respirations Abdomen: soft; poorly localized vague tenderness to palpation of L abdomen; without guarding or rebound tenderness Back: without CVA tenderness; FROM at waist Extremities: without LE edema; symmetrical; without gross deformities Skin: warm and dry Neurologic: normal gait  Psychological: alert and cooperative; normal mood and affect    No Known Allergies                                             Past Medical History:  Diagnosis Date  . Chest pain    Social History   Socioeconomic History  . Marital status: Married    Spouse name: Not on file  . Number of children: 3  . Years of education: Not on file  . Highest education level: Not on file  Occupational History  . Occupation: Pharmacist, hospital  . Occupation: Ecologist  Social Needs  . Financial resource strain: Not on file  . Food insecurity    Worry: Not on file    Inability: Not on file  . Transportation needs    Medical: Not on file    Non-medical: Not on file  Tobacco Use  . Smoking status: Never Smoker  Substance and Sexual Activity  . Alcohol use: No  . Drug use: Not on file  . Sexual activity: Not on file  Lifestyle  . Physical activity    Days per week: Not on file    Minutes per session: Not on file  . Stress: Not on file  Relationships  . Social Herbalist on phone: Not on file    Gets together: Not on file    Attends religious service: Not on file    Active member of club or organization:  Not on file    Attends meetings of clubs or organizations: Not on file    Relationship status: Not on file  . Intimate partner violence    Fear of current or ex partner: Not on file    Emotionally abused: Not on file    Physically abused: Not on file    Forced sexual activity: Not on file  Other Topics Concern  . Not on file  Social History Narrative  . Not on file   Family History  Problem Relation Age of Onset  . Hypertension Mother   . Hypertension Father   . Diabetes Father   . Dementia Other   . Diabetes Other      Mardella LaymanHagler, Valena Ivanov, MD 04/08/19 [id number]    Mardella LaymanHagler, Dickie Cloe, MD 04/08/19 [id number]

## 2019-08-26 ENCOUNTER — Other Ambulatory Visit: Payer: Self-pay

## 2019-08-26 ENCOUNTER — Ambulatory Visit: Payer: BC Managed Care – PPO | Attending: Internal Medicine

## 2019-08-26 DIAGNOSIS — Z20822 Contact with and (suspected) exposure to covid-19: Secondary | ICD-10-CM

## 2019-08-27 LAB — NOVEL CORONAVIRUS, NAA: SARS-CoV-2, NAA: NOT DETECTED

## 2019-11-27 ENCOUNTER — Ambulatory Visit (INDEPENDENT_AMBULATORY_CARE_PROVIDER_SITE_OTHER): Payer: BC Managed Care – PPO | Admitting: Orthopaedic Surgery

## 2019-11-27 ENCOUNTER — Other Ambulatory Visit: Payer: Self-pay

## 2019-11-27 DIAGNOSIS — M4802 Spinal stenosis, cervical region: Secondary | ICD-10-CM

## 2019-11-27 NOTE — Progress Notes (Addendum)
Office Visit Note   Patient: Juan Golden.           Date of Birth: May 13, 1961           MRN: [id number] Visit Date: 11/27/2019              Requested by: Seward Carol, MD [address] [place],  [place] [address] PCP: Seward Carol, MD   Assessment & Plan: Visit Diagnoses:  1. Spinal stenosis of cervical region     Plan: Patient has cervical stenosis had some radicular symptoms consistent with right C7 compression.  Actually thinks is a little bit better than he was last month.  I would recommend proceeding with EMG nerve conduction velocities for his right triceps weakness.  He does have multilevel significant central stenosis but no cord changes and exam findings suggest isolated C7 radiculopathy.  Will see him back after his electrical test.  Follow-Up Instructions: Return after bilateral upper extremity nerve conduction velocities.  Orders:  Orders Placed This Encounter  Procedures  . Ambulatory referral to Physical Medicine Rehab   No orders of the defined types were placed in this encounter.     Procedures: No procedures performed   Clinical Data: No additional findings.   Subjective: Chief Complaint  Patient presents with  . Neck - Pain    HPI 59 but notes right triceps weakness with this.  Normally has no problems doing 25 reps.  Patient had an MRI scan which is available on disc for review.-year-old male referred for severe multilevel cervical stenosis from the [place].  Patient is a retired Engineering geologist from [employer] has had previous left carpal tunnel release.  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  He still is able to do 12 reps benching 135 pounds .  Cervical MRI scan shows central stenosis with cord contact C3-4 through C6-7 with cord compression except at C6-7.  Mild severe foraminal stenosis at C5-6 and C6-7.  Patient is noted no  lower extremity weakness.  No gait disturbance.  No bowel bladder symptoms.  Patient thinks that actually is left arm pain is actually gotten a little bit better in the last month.  He denies any actual neck pain currently.  Review of Systems patient is a non-smoker good physical shape.  He takes Lipitor for cholesterol.  Takes Flomax regularly for some prostatic hypertrophy.  Negative respiratory cardiovascular other than hypertension.   Objective: Vital Signs: There were no vitals taken for this visit.  Physical Exam Constitutional:      Appearance: He is well-developed.  HENT:     Head: Normocephalic and atraumatic.  Eyes:     Pupils: Pupils are equal, round, and reactive to light.  Neck:     Thyroid: No thyromegaly.     Trachea: No tracheal deviation.  Cardiovascular:     Rate and Rhythm: Normal rate.  Pulmonary:     Effort: Pulmonary effort is normal.     Breath sounds: No wheezing.  Abdominal:     General: Bowel sounds are normal.     Palpations: Abdomen is soft.  Skin:    General: Skin is warm and dry.     Capillary Refill: Capillary refill takes less than 2 seconds.  Neurological:     Mental Status: He is alert and oriented to person, place, and time.  Psychiatric:  Behavior: Behavior normal.        Thought Content: Thought content normal.        Judgment: Judgment normal.     Ortho Exam patient has slight triceps weakness on the right versus left more brachial plexus tenderness on the left than right.  Brachial plexus tenderness is rated at mild to moderate.  No lower extremity hyperreflexia normal heel toe gait no clonus.  Excellent biceps strength right and left.  Slight wrist flexion weakness on the right normal on the left.  Mild extensor finger weakness right side only versus left.  Specialty Comments:  No specialty comments available.  Imaging: No results found.   PMFS History: There are no problems to display for this patient.  Past Medical  History:  Diagnosis Date  . Chest pain     Family History  Problem Relation Age of Onset  . Hypertension Mother   . Hypertension Father   . Diabetes Father   . Dementia Other   . Diabetes Other     Past Surgical History:  Procedure Laterality Date  . CARPAL TUNNEL RELEASE     left  . TENDON REPAIR     right arm   Social History   Occupational History  . Occupation: Runner, broadcasting/film/video  . Occupation: [employer]  Tobacco Use  . Smoking status: Never Smoker  Substance and Sexual Activity  . Alcohol use: No  . Drug use: Not on file  . Sexual activity: Not on file

## 2019-12-05 ENCOUNTER — Encounter: Payer: Self-pay | Admitting: Physical Medicine and Rehabilitation

## 2019-12-05 ENCOUNTER — Other Ambulatory Visit: Payer: Self-pay

## 2019-12-05 ENCOUNTER — Ambulatory Visit (INDEPENDENT_AMBULATORY_CARE_PROVIDER_SITE_OTHER): Payer: BC Managed Care – PPO | Admitting: Physical Medicine and Rehabilitation

## 2019-12-05 DIAGNOSIS — R202 Paresthesia of skin: Secondary | ICD-10-CM | POA: Diagnosis not present

## 2019-12-05 DIAGNOSIS — R531 Weakness: Secondary | ICD-10-CM

## 2019-12-05 NOTE — Progress Notes (Signed)
Juan Golden. - 59 y.o. male MRN [id number]  Date of birth: 1961-03-23  Office Visit Note: Visit Date: 12/05/2019 PCP: Renford Dills, MD Referred by: Renford Dills, MD  Subjective: Chief Complaint  Patient presents with  . Right Hand - Numbness  . Left Hand - Numbness   HPI:  Juan Golden. is a 59 y.o. male who comes in today For electrodiagnostic study of both upper limbs at the request of Dr. Annell Greening.  Patient is right-hand dominant with a history of prior carpal tunnel release on the left.  Now having pain and paresthesia fairly globally in the left arm and hand but in particular in the medial 2 digits.  He is also having some pain but mostly weakness in the right tricep and some pain in the forearm on the right.  No tingling or numbness on the right.  Does have a history of neck pain.  MRI has been done of the cervical spine and this is reviewed below.  Patient has had prior electrodiagnostic study on the left but we do not have that for review.  He has no history of diabetes diabetes.  ROS Otherwise per HPI.  Assessment & Plan: Visit Diagnoses:  1. Paresthesia of skin   2. Weakness     Plan: Impression: The above electrodiagnostic study is ABNORMAL and reveals evidence of moderate chronic C7 and C8 radiculopathy on the left.    There is no significant electrodiagnostic evidence of any other focal nerve entrapment, brachial plexopathy or generalized peripheral neuropathy. In particular, no radial nerve neuropathy or evidence of radiculopathy on the right.  Recommendations: 1.  Follow-up with referring physician. 2.  Continue current management of symptoms.  Meds & Orders: No orders of the defined types were placed in this encounter.   Orders Placed This Encounter  Procedures  . NCV with EMG (electromyography)    Follow-up: Return for Annell Greening, M.D..   Procedures: No procedures performed  EMG & NCV Findings: Evaluation of the left median (across palm)  sensory nerve showed no response (Palm).  All remaining nerves (as indicated in the following tables) were within normal limits.    Needle evaluation of the left triceps muscle showed diminished recruitment.  The left Ext Digitorum muscle showed increased insertional activity, moderately increased spontaneous activity, and diminished recruitment.  All remaining muscles (as indicated in the following table) showed no evidence of electrical instability.    Impression: The above electrodiagnostic study is ABNORMAL and reveals evidence of moderate chronic C7 and C8 radiculopathy on the left.    There is no significant electrodiagnostic evidence of any other focal nerve entrapment, brachial plexopathy or generalized peripheral neuropathy. In particular, no radial nerve neuropathy or evidence of radiculopathy on the right.  Recommendations: 1.  Follow-up with referring physician. 2.  Continue current management of symptoms.  ___________________________ Naaman Plummer FAAPMR Board Certified, American Board of Physical Medicine and Rehabilitation    Nerve Conduction Studies Anti Sensory Summary Table   Stim Site NR Peak (ms) Norm Peak (ms) P-T Amp (V) Norm P-T Amp Site1 Site2 Delta-P (ms) Dist (cm) Vel (m/s) Norm Vel (m/s)  Left Median Acr Palm Anti Sensory (2nd Digit)  32.5C  Wrist    3.4 <3.6 20.8 >10 Wrist Palm  0.0    Palm *NR  <2.0          Left Radial Anti Sensory (Base 1st Digit)  32.2C  Wrist    2.1 <3.1 21.9  Wrist Base  1st Digit 2.1 0.0    Left Ulnar Anti Sensory (5th Digit)  32.6C  Wrist    3.4 <3.7 20.9 >15.0 Wrist 5th Digit 3.4 14.0 41 >38   Motor Summary Table   Stim Site NR Onset (ms) Norm Onset (ms) O-P Amp (mV) Norm O-P Amp Site1 Site2 Delta-0 (ms) Dist (cm) Vel (m/s) Norm Vel (m/s)  Left Median Motor (Abd Poll Brev)  32.4C  Wrist    3.5 <4.2 8.5 >5 Elbow Wrist 4.5 24.5 54 >50  Elbow    8.0  7.5         Right Radial Motor (Ext Indicis)  30.9C  8cm    2.3 <2.5 2.3  >1.7 Up Arm 8cm 4.7 28.0 60 >60  Up Arm    7.0  2.1         Left Ulnar Motor (Abd Dig Min)  32.6C  Wrist    2.9 <4.2 7.6 >3 B Elbow Wrist 4.1 24.0 59 >53  B Elbow    7.0  6.9  A Elbow B Elbow 1.8 11.0 61 >53  A Elbow    8.8  6.9          EMG   Side Muscle Nerve Root Ins Act Fibs Psw Amp Dur Poly Recrt Int Dennie Bible Comment  Right 1stDorInt Ulnar C8-T1 Nml Nml Nml Nml Nml 0 Nml Nml   Right Abd Poll Brev Median C8-T1 Nml Nml Nml Nml Nml 0 Nml Nml   Right ExtDigCom   Nml Nml Nml Nml Nml 0 Nml Nml   Right Triceps Radial C6-7-8 Nml Nml Nml Nml Nml 0 Nml Nml   Right Deltoid Axillary C5-6 Nml Nml Nml Nml Nml 0 Nml Nml   Left 1stDorInt Ulnar C8-T1 Nml Nml Nml Nml Nml 0 Nml Nml   Left Abd Poll Brev Median C8-T1 Nml Nml Nml Nml Nml 0 Nml Nml   Left Triceps Radial C6-7-8 Nml Nml Nml Nml Nml 0 *Reduced Nml   Left Deltoid Axillary C5-6 Nml Nml Nml Nml Nml 0 Nml Nml   Left Ext Digitorum  Radial (Post Int) C7-8 *Incr *2+ *2+ Nml Nml 0 *Reduced Nml     Nerve Conduction Studies Anti Sensory Left/Right Comparison   Stim Site L Lat (ms) R Lat (ms) L-R Lat (ms) L Amp (V) R Amp (V) L-R Amp (%) Site1 Site2 L Vel (m/s) R Vel (m/s) L-R Vel (m/s)  Median Acr Palm Anti Sensory (2nd Digit)  32.5C  Wrist 3.4   20.8   Wrist Palm     Palm             Radial Anti Sensory (Base 1st Digit)  32.2C  Wrist 2.1   21.9   Wrist Base 1st Digit     Ulnar Anti Sensory (5th Digit)  32.6C  Wrist 3.4   20.9   Wrist 5th Digit 41     Motor Left/Right Comparison   Stim Site L Lat (ms) R Lat (ms) L-R Lat (ms) L Amp (mV) R Amp (mV) L-R Amp (%) Site1 Site2 L Vel (m/s) R Vel (m/s) L-R Vel (m/s)  Median Motor (Abd Poll Brev)  32.4C  Wrist 3.5   8.5   Elbow Wrist 54    Elbow 8.0   7.5         Radial Motor (Ext Indicis)  30.9C  8cm  2.3   2.3  Up Arm 8cm  60   Up Arm  7.0   2.1  Ulnar Motor (Abd Dig Min)  32.6C  Wrist 2.9   7.6   B Elbow Wrist 59    B Elbow 7.0   6.9   A Elbow B Elbow 61    A Elbow 8.8   6.9              Waveforms:              Clinical History: Acute Interface, Incoming Rad Results - 10/21/2019 10:34 AM EST TECHNIQUE:  MRI SPINE CERVICAL WO IV CONTRAST - routine multiplanar multisequence MRI of the cervical spine was performed, without contrast.  COMPARISON:  None  INDICATION: Cervicalgia.  Exam date/time:  10/18/2019 6:41 PM  FINDINGS:  Craniovertebral junction: Normal Alignment: Slight straightening of cervical lordosis in the mid cervical spine. Slight retrolisthesis of C4 on C5 and C5 on C6. Coronal scout images show no significant cervical curvature. Vertebrae/marrow: Minimal mixed Modic type I and Modic type II degenerative endplate changes at C5-C6. Spinal canal: No primary intradural pathology. There is some cord contact and possible minimal cord compression mid cervical spine. See below. No primary intradural pathology. Other findings: There is thyroid gland enlargement measuring 6.1 cm in length on the right and 6.6 cm in length on the left. There is 1 cm, T2 hyperintense nodule in the inferior right gland. Possible pyramidal lobe anteriorly and thyroglossal duct.   Disc levels: C2-C3: Minimal posterior disc bulge with an annular fissure. Some dural thickening, nonspecific. Uncovertebral spur on the left. Also small synovial cyst on the left in the neuroforamen. Mild left foraminal stenosis. Left foraminal narrowing. C3-C4: Degenerative circumferentially bulging disc with superimposed annular fissure and small superior and inferior extrusions. Uncovertebral spurs left greater than right. Buckling of ligamentum flavum. Mild to moderate central stenosis with cord contact and minimal focal cord compression. Moderate bilateral foraminal stenosis. C4-C5: Degenerative circumferentially bulging disc. Minimal buckling of ligamentum flavum. Mild to moderate central stenosis with cord contact minimal cord compression.. Uncovertebral spurs and facet arthritis. Moderate, left  greater than right foraminal stenosis. C5-C6: Degenerative circumferentially bulging disc inferior disc extrusion. Mild central stenosis. Adjacency to the dorsal and ventral cord without significant cord compression. Moderate-severe bilateral foraminal stenosis from uncovertebral spurs. C6-C7: Degenerative circumferentially bulging disc with small posterior lateral protrusions left greater than right. Uncovertebral spurs. Focal buckling of ligamentum flavum. Mild central stenosis with adjacency to the dorsal and ventral cord. Moderate to severe bilateral foraminal stenosis. C7-T1: Normal   IMPRESSION: 1.  Degenerative circumferentially bulging disks, and buckling of ligamentum flavum. Uncovertebral spurs at multiple mid cervical levels. 2.  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 There is cord compression at these levels except for C6-C7 where there is contact, without compression. Multilevel foraminal stenosis, that is moderate to severe at C5-C6 and C6-C7. 3.  Thyroid enlargement with 1 cm nodule in the inferior right gland. There is a probable pyramidal lobe and thyroglossal duct.  Electronically Signed by: Neal Dy     Objective:  VS:  HT:    WT:   BMI:     BP:   HR: bpm  TEMP: ( )  RESP:  Physical Exam Musculoskeletal:        General: No tenderness.     Comments: Inspection reveals perhaps decreased muscle bulk of the right tricep compared to left but its pretty mild if it is there there.  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  Inspection also reveals carpal tunnel release scar on  the left wrist but no atrophy of the bilateral APB or FDI or hand intrinsics. There is no swelling, color changes, allodynia or dystrophic changes. There is 5 out of 5 strength in the bilateral wrist extension, finger abduction and long finger flexion.  There is subjective impaired sensation on the left somewhat globally.   There is a negative  Hoffmann's test bilaterally.  Skin:    General: Skin is warm and dry.     Findings: No erythema or rash.  Neurological:     General: No focal deficit present.     Mental Status: He is alert and oriented to person, place, and time.     Sensory: No sensory deficit.     Motor: No weakness or abnormal muscle tone.     Coordination: Coordination normal.     Gait: Gait normal.  Psychiatric:        Mood and Affect: Mood normal.        Behavior: Behavior normal.        Thought Content: Thought content normal.     Ortho Exam Imaging: No results found.

## 2019-12-05 NOTE — Progress Notes (Signed)
     Numeric Pain Rating Scale and Functional Assessment Average Pain (1)   In the last MONTH (on 0-10 scale) has pain interfered with the following?  1. General activity like being  able to carry out your everyday physical activities such as walking, climbing stairs, carrying groceries, or moving a chair?  Rating(1)

## 2019-12-06 ENCOUNTER — Telehealth: Payer: Self-pay | Admitting: Orthopaedic Surgery

## 2019-12-06 NOTE — Telephone Encounter (Signed)
Chelsea from the [facility] called.  She needs the office notes from the patient's last two appointments here faxed over.   Fax number: [phone]

## 2019-12-06 NOTE — Telephone Encounter (Signed)
Could you fax these for me please? Thanks.

## 2019-12-09 NOTE — Telephone Encounter (Signed)
Faxed [phone]

## 2019-12-09 NOTE — Procedures (Signed)
EMG & NCV Findings: Evaluation of the left median (across palm) sensory nerve showed no response (Palm).  All remaining nerves (as indicated in the following tables) were within normal limits.    Needle evaluation of the left triceps muscle showed diminished recruitment.  The left Ext Digitorum muscle showed increased insertional activity, moderately increased spontaneous activity, and diminished recruitment.  All remaining muscles (as indicated in the following table) showed no evidence of electrical instability.    Impression: The above electrodiagnostic study is ABNORMAL and reveals evidence of moderate chronic C7 and C8 radiculopathy on the left.    There is no significant electrodiagnostic evidence of any other focal nerve entrapment, brachial plexopathy or generalized peripheral neuropathy. In particular, no radial nerve neuropathy or evidence of radiculopathy on the right.  Recommendations: 1.  Follow-up with referring physician. 2.  Continue current management of symptoms.  ___________________________ Juan Golden FAAPMR Board Certified, American Board of Physical Medicine and Rehabilitation    Nerve Conduction Studies Anti Sensory Summary Table   Stim Site NR Peak (ms) Norm Peak (ms) P-T Amp (V) Norm P-T Amp Site1 Site2 Delta-P (ms) Dist (cm) Vel (m/s) Norm Vel (m/s)  Left Median Acr Palm Anti Sensory (2nd Digit)  32.5C  Wrist    3.4 <3.6 20.8 >10 Wrist Palm  0.0    Palm *NR  <2.0          Left Radial Anti Sensory (Base 1st Digit)  32.2C  Wrist    2.1 <3.1 21.9  Wrist Base 1st Digit 2.1 0.0    Left Ulnar Anti Sensory (5th Digit)  32.6C  Wrist    3.4 <3.7 20.9 >15.0 Wrist 5th Digit 3.4 14.0 41 >38   Motor Summary Table   Stim Site NR Onset (ms) Norm Onset (ms) O-P Amp (mV) Norm O-P Amp Site1 Site2 Delta-0 (ms) Dist (cm) Vel (m/s) Norm Vel (m/s)  Left Median Motor (Abd Poll Brev)  32.4C  Wrist    3.5 <4.2 8.5 >5 Elbow Wrist 4.5 24.5 54 >50  Elbow    8.0  7.5           Right Radial Motor (Ext Indicis)  30.9C  8cm    2.3 <2.5 2.3 >1.7 Up Arm 8cm 4.7 28.0 60 >60  Up Arm    7.0  2.1         Left Ulnar Motor (Abd Dig Min)  32.6C  Wrist    2.9 <4.2 7.6 >3 B Elbow Wrist 4.1 24.0 59 >53  B Elbow    7.0  6.9  A Elbow B Elbow 1.8 11.0 61 >53  A Elbow    8.8  6.9          EMG   Side Muscle Nerve Root Ins Act Fibs Psw Amp Dur Poly Recrt Int Fraser Din Comment  Right 1stDorInt Ulnar C8-T1 Nml Nml Nml Nml Nml 0 Nml Nml   Right Abd Poll Brev Median C8-T1 Nml Nml Nml Nml Nml 0 Nml Nml   Right ExtDigCom   Nml Nml Nml Nml Nml 0 Nml Nml   Right Triceps Radial C6-7-8 Nml Nml Nml Nml Nml 0 Nml Nml   Right Deltoid Axillary C5-6 Nml Nml Nml Nml Nml 0 Nml Nml   Left 1stDorInt Ulnar C8-T1 Nml Nml Nml Nml Nml 0 Nml Nml   Left Abd Poll Brev Median C8-T1 Nml Nml Nml Nml Nml 0 Nml Nml   Left Triceps Radial C6-7-8 Nml Nml Nml Nml Nml 0 *Reduced Nml  Left Deltoid Axillary C5-6 Nml Nml Nml Nml Nml 0 Nml Nml   Left Ext Digitorum  Radial (Post Int) C7-8 *Incr *2+ *2+ Nml Nml 0 *Reduced Nml     Nerve Conduction Studies Anti Sensory Left/Right Comparison   Stim Site L Lat (ms) R Lat (ms) L-R Lat (ms) L Amp (V) R Amp (V) L-R Amp (%) Site1 Site2 L Vel (m/s) R Vel (m/s) L-R Vel (m/s)  Median Acr Palm Anti Sensory (2nd Digit)  32.5C  Wrist 3.4   20.8   Wrist Palm     Palm             Radial Anti Sensory (Base 1st Digit)  32.2C  Wrist 2.1   21.9   Wrist Base 1st Digit     Ulnar Anti Sensory (5th Digit)  32.6C  Wrist 3.4   20.9   Wrist 5th Digit 41     Motor Left/Right Comparison   Stim Site L Lat (ms) R Lat (ms) L-R Lat (ms) L Amp (mV) R Amp (mV) L-R Amp (%) Site1 Site2 L Vel (m/s) R Vel (m/s) L-R Vel (m/s)  Median Motor (Abd Poll Brev)  32.4C  Wrist 3.5   8.5   Elbow Wrist 54    Elbow 8.0   7.5         Radial Motor (Ext Indicis)  30.9C  8cm  2.3   2.3  Up Arm 8cm  60   Up Arm  7.0   2.1        Ulnar Motor (Abd Dig Min)  32.6C  Wrist 2.9   7.6   B Elbow Wrist 59    B  Elbow 7.0   6.9   A Elbow B Elbow 61    A Elbow 8.8   6.9            Waveforms:

## 2019-12-17 ENCOUNTER — Ambulatory Visit (INDEPENDENT_AMBULATORY_CARE_PROVIDER_SITE_OTHER): Payer: No Typology Code available for payment source | Admitting: Orthopaedic Surgery

## 2019-12-17 ENCOUNTER — Ambulatory Visit: Payer: Non-veteran care | Admitting: Orthopaedic Surgery

## 2019-12-17 ENCOUNTER — Other Ambulatory Visit: Payer: Self-pay

## 2019-12-17 ENCOUNTER — Encounter: Payer: Self-pay | Admitting: Orthopaedic Surgery

## 2019-12-17 DIAGNOSIS — M5412 Radiculopathy, cervical region: Secondary | ICD-10-CM | POA: Diagnosis not present

## 2019-12-17 DIAGNOSIS — M4802 Spinal stenosis, cervical region: Secondary | ICD-10-CM

## 2019-12-17 NOTE — Progress Notes (Signed)
Office Visit Note   Patient: Juan Golden.           Date of Birth: March 14, 1961           MRN: [id number] Visit Date: 12/17/2019              Requested by: Renford Dills, MD [address] [place],  [place] [address] PCP: Renford Dills, MD   Assessment & Plan: Visit Diagnoses:  1. Radiculopathy, cervical region   2. Spinal stenosis of cervical region     Plan: We reviewed again images of his MRI scan.  He has multilevel stenosis and understands that with the atrophy on his right side but acute changes showing compression and acute radiculitis on the left side he would need cervical decompression surgery and fusion likely at multi levels due to moderate stenosis at several levels with marked foraminal stenosis.  Currently is not really having pain is not really noticing loss of function.  Despite the fact that he is got triceps atrophy and some weakness he still has stronger triceps and almost all of the patient's at his age due to years of weight training.  I will check him back again in 3 months and we discussed signs and symptoms of myelopathy or progressive radiculopathy for him to look for.  These occurs he will contact us.  Follow-Up Instructions: Return in about 3 months (around 03/17/2020).   Orders:  No orders of the defined types were placed in this encounter.  No orders of the defined types were placed in this encounter.     Procedures: No procedures performed   Clinical Data: No additional findings.   Subjective: Chief Complaint  Patient presents with  . 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  He has some stenosis from C3-4 down to C6-7 and foraminal stenosis particularly at C6-7.  Electrical test surprisingly show acute changes on the opposite left side where he is not noticed any  weakness.  This suggests C7-C8 radiculopathy on the left.  No evidence of peripheral nerve entrapment in the left upper extremity.  He denies any gait disturbance no problems with stairs.  He states when he works out he still notes that his triceps are firm and pumped.  Opposite right side he states does not tighten up as it used to.  He is used ibuprofen occasionally with relief.  He is not really having any significant pain at this point.  Review of Systems 14 point system unchanged from 11/27/2019.   Objective: Vital Signs: Ht 5\' 11"  (1.803 m)   Wt 210 lb (95.3 kg)   BMI 29.29 kg/m   Physical Exam Constitutional:      Appearance: He is well-developed.  HENT:     Head: Normocephalic and atraumatic.  Eyes:     Pupils: Pupils are equal, round, and reactive to light.  Neck:     Thyroid: No thyromegaly.     Trachea: No tracheal deviation.  Cardiovascular:     Rate and Rhythm: Normal rate.  Pulmonary:     Effort: Pulmonary effort is normal.     Breath sounds: No wheezing.  Abdominal:     General: Bowel sounds are normal.     Palpations: Abdomen is soft.  Skin:    General: Skin is warm and dry.  Capillary Refill: Capillary refill takes less than 2 seconds.  Neurological:     Mental Status: He is alert and oriented to person, place, and time.  Psychiatric:        Behavior: Behavior normal.        Thought Content: Thought content normal.        Judgment: Judgment normal.     Ortho Exam patient has good upper extremity muscle development with symmetric the right triceps mild right triceps weakness but no wrist flexion or finger extension weakness.  Trace FCU weakness on the right.  Left arm notes no atrophy of the triceps normal strength testing.  Specialty Comments:  Procedures:  No procedures performed  EMG & NCV Findings:  Evaluation of the left median (across palm) sensory nerve showed no response (Palm). All remaining nerves (as indicated in the following tables) were  within normal limits.  Needle evaluation of the left triceps muscle showed diminished recruitment. The left Ext Digitorum muscle showed increased insertional activity, moderately increased spontaneous activity, and diminished recruitment. All remaining muscles (as indicated in the following table) showed no evidence of electrical instability.  Impression:  The above electrodiagnostic study is ABNORMAL and reveals evidence of moderate chronic C7 and C8 radiculopathy on the left.  There is no significant electrodiagnostic evidence of any other focal nerve entrapment, brachial plexopathy or generalized peripheral neuropathy. In particular, no radial nerve neuropathy or evidence of radiculopathy on the right.  Recommendations:  1. Follow-up with referring physician.  2. Continue current management of symptoms.  ___________________________  Laurence Spates FAAPMR  Board Certified, American Board of Physical Medicine and Rehabilitation  Nerve Conduction Studies  Anti Sensory Summary Table  Stim Site NR Peak (ms) Norm Peak (ms) P-T Amp (V) Norm P-T Amp Site1 Site2 Delta-P (ms) Dist (cm) Vel (m/s) Norm Vel (m/s)  Left Median Acr Palm Anti Sensory (2nd Digit) 32.5C  Wrist  3.4 <3.6 20.8 >10 Wrist Palm  0.0    Palm *NR  <2.0          Left Radial Anti Sensory (Base 1st Digit) 32.2C  Wrist  2.1 <3.1 21.9  Wrist Base 1st Digit 2.1 0.0    Left Ulnar Anti Sensory (5th Digit) 32.6C  Wrist  3.4 <3.7 20.9 >15.0 Wrist 5th Digit 3.4 14.0 41 >38  Motor Summary Table  Stim Site NR Onset (ms) Norm Onset (ms) O-P Amp (mV) Norm O-P Amp Site1 Site2 Delta-0 (ms) Dist (cm) Vel (m/s) Norm Vel (m/s)  Left Median Motor (Abd Poll Brev) 32.4C  Wrist  3.5 <4.2 8.5 >5 Elbow Wrist 4.5 24.5 54 >50  Elbow  8.0  7.5         Right Radial Motor (Ext Indicis) 30.9C  8cm  2.3 <2.5 2.3 >1.7 Up Arm 8cm 4.7 28.0 60 >60  Up Arm  7.0  2.1         Left Ulnar Motor (Abd Dig Min) 32.6C  Wrist  2.9 <4.2 7.6 >3 B Elbow Wrist 4.1 24.0  59 >53  B Elbow  7.0  6.9  A Elbow B Elbow 1.8 11.0 61 >53  A Elbow  8.8  6.9         EMG  Side Muscle Nerve Root Ins Act Fibs Psw Amp Dur Poly Recrt Int Fraser Din Comment  Right 1stDorInt Ulnar C8-T1 Nml Nml Nml Nml Nml 0 Nml Nml   Right Abd Poll Brev Median C8-T1 Nml Nml Nml Nml Nml 0 Nml Nml   Right ExtDigCom  Nml Nml Nml Nml Nml 0 Nml Nml   Right Triceps Radial C6-7-8 Nml Nml Nml Nml Nml 0 Nml Nml   Right Deltoid Axillary C5-6 Nml Nml Nml Nml Nml 0 Nml Nml   Left 1stDorInt Ulnar C8-T1 Nml Nml Nml Nml Nml 0 Nml Nml   Left Abd Poll Brev Median C8-T1 Nml Nml Nml Nml Nml 0 Nml Nml   Left Triceps Radial C6-7-8 Nml Nml Nml Nml Nml 0 *Reduced Nml   Left Deltoid Axillary C5-6 Nml Nml Nml Nml Nml 0 Nml Nml   Left Ext Digitorum  Radial (Post Int) C7-8 *Incr *2+ *2+ Nml Nml 0 *Reduced Nml   Nerve Conduction Studies  Anti Sensory Left/Right Comparison  Stim Site L Lat (ms) R Lat (ms) L-R Lat (ms) L Amp (V) R Amp (V) L-R Amp (%) Site1 Site2 L Vel (m/s) R Vel (m/s) L-R Vel (m/s)  Median Acr Palm Anti Sensory (2nd Digit) 32.5C  Wrist 3.4   20.8   Wrist Palm     Palm             Radial Anti Sensory (Base 1st Digit) 32.2C  Wrist 2.1   21.9   Wrist Base 1st Digit     Ulnar Anti Sensory (5th Digit) 32.6C  Wrist 3.4   20.9   Wrist 5th Digit 41    Motor Left/Right Comparison  Stim Site L Lat (ms) R Lat (ms) L-R Lat (ms) L Amp (mV) R Amp (mV) L-R Amp (%) Site1 Site2 L Vel (m/s) R Vel (m/s) L-R Vel (m/s)  Median Motor (Abd Poll Brev) 32.4C  Wrist 3.5   8.5   Elbow Wrist 54    Elbow 8.0   7.5         Radial Motor (Ext Indicis) 30.9C  8cm  2.3   2.3  Up Arm 8cm  60   Up Arm  7.0   2.1        Ulnar Motor (Abd Dig Min) 32.6C  Wrist 2.9   7.6   B Elbow Wrist 59    B Elbow 7.0   6.9   A Elbow B Elbow 61    A Elbow 8.8   6.9         Waveforms:     Clinical History:  Acute Interface, Incoming Rad Results - 10/21/2019 10:34 AM EST  TECHNIQUE: MRI SPINE CERVICAL WO IV CONTRAST - routine multiplanar  multisequence MRI of the cervical spine was performed, without contrast.  COMPARISON: None  INDICATION: Cervicalgia.  Exam date/time: 10/18/2019 6:41 PM  FINDINGS:  Craniovertebral junction: Normal  Alignment: Slight straightening of cervical lordosis in the mid cervical spine. Slight retrolisthesis of C4 on C5 and C5 on C6. Coronal scout images show no significant cervical curvature.  Vertebrae/marrow: Minimal mixed Modic type I and Modic type II degenerative endplate changes at C5-C6.  Spinal canal: No primary intradural pathology. There is some cord contact and possible minimal cord compression mid cervical spine. See below. No primary intradural pathology.  Other findings: There is thyroid gland enlargement measuring 6.1 cm in length on the right and 6.6 cm in length on the left. There is 1 cm, T2 hyperintense nodule in the inferior right gland. Possible pyramidal lobe anteriorly and thyroglossal duct.  Disc levels:  C2-C3: Minimal posterior disc bulge with an annular fissure. Some dural thickening, nonspecific. Uncovertebral spur on the left. Also small synovial cyst on the left in the neuroforamen. Mild left foraminal stenosis. Left foraminal narrowing.  C3-C4: Degenerative circumferentially bulging disc with superimposed annular fissure and small superior and inferior extrusions. Uncovertebral spurs left greater than right. Buckling of ligamentum flavum. Mild to moderate central stenosis with cord contact and minimal focal cord compression. Moderate bilateral foraminal stenosis.  C4-C5: Degenerative circumferentially bulging disc. Minimal buckling of ligamentum flavum. Mild to moderate central stenosis with cord contact minimal cord compression.. Uncovertebral spurs and facet arthritis. Moderate, left greater than right foraminal stenosis.  C5-C6: Degenerative circumferentially bulging disc inferior disc extrusion. Mild central stenosis. Adjacency to the dorsal and ventral cord without  significant cord compression. Moderate-severe bilateral foraminal stenosis from uncovertebral spurs.  C6-C7: Degenerative circumferentially bulging disc with small posterior lateral protrusions left greater than right. Uncovertebral spurs. Focal buckling of ligamentum flavum. Mild central stenosis with adjacency to the dorsal and ventral cord. Moderate to severe bilateral foraminal stenosis.  C7-T1: Normal  IMPRESSION:  1. Degenerative circumferentially bulging disks, and buckling of ligamentum flavum. Uncovertebral spurs at multiple mid cervical levels.  2. 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 There is cord compression at these levels except for C6-C7 where there is contact, without compression. Multilevel foraminal stenosis, that is moderate to severe at C5-C6 and C6-C7.  3. Thyroid enlargement with 1 cm nodule in the inferior right gland. There is a probable pyramidal lobe and thyroglossal duct.  Electronically Signed by: Neal Dy    Imaging: No results found.   PMFS History: Patient Active Problem List   Diagnosis Date Noted  . Radiculopathy, cervical region 12/17/2019  . Spinal stenosis of cervical region 12/17/2019   Past Medical History:  Diagnosis Date  . Chest pain     Family History  Problem Relation Age of Onset  . Hypertension Mother   . Hypertension Father   . Diabetes Father   . Dementia Other   . Diabetes Other     Past Surgical History:  Procedure Laterality Date  . CARPAL TUNNEL RELEASE     left  . TENDON REPAIR     right arm   Social History   Occupational History  . Occupation: Runner, broadcasting/film/video  . Occupation: [employer]  Tobacco Use  . Smoking status: Never Smoker  . Smokeless tobacco: Never Used  Substance and Sexual Activity  . Alcohol use: No  . Drug use: Not on file  . Sexual activity: Not on file

## 2020-04-20 ENCOUNTER — Other Ambulatory Visit: Payer: Self-pay | Admitting: Family Medicine

## 2020-04-20 ENCOUNTER — Ambulatory Visit: Payer: Self-pay

## 2020-04-20 ENCOUNTER — Other Ambulatory Visit: Payer: Self-pay | Admitting: Internal Medicine

## 2020-04-20 ENCOUNTER — Ambulatory Visit
Admission: RE | Admit: 2020-04-20 | Discharge: 2020-04-20 | Disposition: A | Discharge status: Home / Self Care | Payer: BC Managed Care – PPO | Source: Ambulatory Visit | Attending: Internal Medicine | Admitting: Internal Medicine

## 2020-04-20 ENCOUNTER — Other Ambulatory Visit: Payer: Self-pay

## 2020-04-20 DIAGNOSIS — M79645 Pain in left finger(s): Secondary | ICD-10-CM

## 2020-04-20 DIAGNOSIS — R52 Pain, unspecified: Secondary | ICD-10-CM

## 2020-04-29 ENCOUNTER — Ambulatory Visit: Payer: BC Managed Care – PPO | Admitting: Podiatry

## 2020-04-29 ENCOUNTER — Other Ambulatory Visit: Payer: Self-pay | Admitting: Podiatry

## 2020-04-29 ENCOUNTER — Other Ambulatory Visit: Payer: Self-pay

## 2020-04-29 ENCOUNTER — Ambulatory Visit (INDEPENDENT_AMBULATORY_CARE_PROVIDER_SITE_OTHER): Payer: BC Managed Care – PPO

## 2020-04-29 DIAGNOSIS — B351 Tinea unguium: Secondary | ICD-10-CM | POA: Diagnosis not present

## 2020-04-29 DIAGNOSIS — S9031XA Contusion of right foot, initial encounter: Secondary | ICD-10-CM

## 2020-04-29 DIAGNOSIS — M79671 Pain in right foot: Secondary | ICD-10-CM | POA: Diagnosis not present

## 2020-04-29 DIAGNOSIS — S90211A Contusion of right great toe with damage to nail, initial encounter: Secondary | ICD-10-CM | POA: Diagnosis not present

## 2020-04-29 MED ORDER — TERBINAFINE HCL 250 MG PO TABS: 250.0000 mg | ORAL_TABLET | Freq: Every day | ORAL | 0 refills | Status: AC

## 2020-04-29 NOTE — Progress Notes (Signed)
  Subjective:  Patient ID: Juan Kay., male    DOB: [birth year],  MRN: [id number]  Chief Complaint  Patient presents with  . Toe Pain    PT stated i dropped a big stone on my Right hallux 8 days ago pain is a 2/10     59 y.o. male presents with the above complaint. History confirmed with patient.  He was moving a rock in a friend's yard and the stone fell on his right hallux.  It damaged his toenail and had bleeding underneath and that has come off and stop bleeding.  He also has nail fungus on his toenails and inquires if there is treatment for this.  Objective:  Physical Exam: warm, good capillary refill, no trophic changes or ulcerative lesions, normal DP and PT pulses and normal sensory exam.   Right Foot: No pain on palpation to the distal phalanx, proximal phalanx, or with range of motion of the IPJ.  There is nail damage and dried blood of the hallux nail but the base is secured well to the nailbed.  Onychomycosis x5  Radiographs: X-ray of the right foot: no fracture, dislocation, swelling or degenerative changes noted Assessment:   1. Foot pain, right   2. Contusion of right great toe with damage to nail, initial encounter   3. Onychomycosis      Plan:  Patient was evaluated and treated and all questions answered.  -Advised his contusion of his hallux should resolve uneventfully and that he does not have a fracture currently.  -Discussed treatment options for onychomycosis.  Recommended terbinafine treatment x90 days.  He does not drink and does not have a history of liver disease   Return in about 4 months (around 08/29/2020) for nail re-check.

## 2021-09-01 ENCOUNTER — Ambulatory Visit (HOSPITAL_COMMUNITY)
Admission: EM | Admit: 2021-09-01 | Discharge: 2021-09-01 | Disposition: A | Discharge status: Home / Self Care | Payer: No Typology Code available for payment source | Attending: Family Medicine | Admitting: Family Medicine

## 2021-09-01 ENCOUNTER — Encounter (HOSPITAL_COMMUNITY): Payer: Self-pay | Admitting: Emergency Medicine

## 2021-09-01 ENCOUNTER — Other Ambulatory Visit: Payer: Self-pay

## 2021-09-01 DIAGNOSIS — Z23 Encounter for immunization: Secondary | ICD-10-CM | POA: Diagnosis not present

## 2021-09-01 DIAGNOSIS — S81812A Laceration without foreign body, left lower leg, initial encounter: Secondary | ICD-10-CM

## 2021-09-01 MED ORDER — TETANUS-DIPHTH-ACELL PERTUSSIS 5-2.5-18.5 LF-MCG/0.5 IM SUSY
0.5000 mL | PREFILLED_SYRINGE | Freq: Once | INTRAMUSCULAR | Status: AC
  Administered 2021-09-01: 0.5 mL via INTRAMUSCULAR

## 2021-09-01 MED ORDER — LIDOCAINE-EPINEPHRINE 1 %-1:100000 IJ SOLN
INTRAMUSCULAR | Status: AC
  Filled 2021-09-01: qty 1

## 2021-09-01 MED ORDER — TETANUS-DIPHTH-ACELL PERTUSSIS 5-2.5-18.5 LF-MCG/0.5 IM SUSY
PREFILLED_SYRINGE | INTRAMUSCULAR | Status: AC
  Filled 2021-09-01: qty 0.5

## 2021-09-01 NOTE — Discharge Instructions (Signed)
You received 3 stitches.  This should be removed in 14 days.  You can see your primary care provider or return here for this.  Keep the area clean and dry.  You can wash with warm soapy water, then pat to dry.  Keep a nonadherent dressing over it.  Over the next few days, you can use antibiotic ointment on it as well.  If you have significant worsening of pain, redness, fever, pus, you should be seen by medical provider right away.  He also received a tetanus booster today.

## 2021-09-01 NOTE — ED Provider Notes (Signed)
[facility]    CSN: [id number] Arrival date & time: 09/01/21  1118      History   Chief Complaint Chief Complaint  Patient presents with   Fall   Leg Injury    HPI Juan Golden. is a 61 y.o. male.   Left Leg Laceration Fell from a ladder last PM around 6PM Has been able to walk, but has a laceration that he would like sutured No fevers Otherwise well Denies other injuries Didn't hit head and no LOC Unsure of last tetanus   Past Medical History:  Diagnosis Date   Chest pain     Patient Active Problem List   Diagnosis Date Noted   Radiculopathy, cervical region 12/17/2019   Spinal stenosis of cervical region 12/17/2019    Past Surgical History:  Procedure Laterality Date   CARPAL TUNNEL RELEASE     left   TENDON REPAIR     right arm       Home Medications    Prior to Admission medications   Medication Sig Start Date End Date Taking? Authorizing Provider  atorvastatin (LIPITOR) 20 MG tablet Take 20 mg by mouth daily.    [provider]  Ibuprofen (ADVIL) 200 MG CAPS Take by mouth as needed.    [provider]  NORCO 5-325 MG tablet Take 1 tablet by mouth every 6 (six) hours as needed. 11/07/19   [provider]  ondansetron (ZOFRAN ODT) 4 MG disintegrating tablet Take 1 tablet (4 mg total) by mouth every 8 (eight) hours as needed for nausea or vomiting. 04/04/19   Antonietta Breach, PA-C  oxyCODONE-acetaminophen (PERCOCET/ROXICET) 5-325 MG tablet Take 1-2 tablets by mouth every 6 (six) hours as needed for severe pain. Patient not taking: Reported on 12/17/2019 04/04/19   Antonietta Breach, PA-C  tadalafil (CIALIS) 20 MG tablet TAKE 1 TABLET BY MOUTH ONCE DAILY AS NEEDED FOR 30 DAYS 02/11/20   [provider]  tamsulosin (FLOMAX) 0.4 MG CAPS capsule Take 1 capsule (0.4 mg total) by mouth daily. 04/04/19   Antonietta Breach, PA-C    Family History Family History  Problem Relation Age of Onset   Hypertension Mother     Hypertension Father    Diabetes Father    Dementia Other    Diabetes Other     Social History Social History   Tobacco Use   Smoking status: Never   Smokeless tobacco: Never  Substance Use Topics   Alcohol use: No     Allergies   Patient has no known allergies.   Review of Systems Review of Systems  All other systems reviewed and are negative. Per HPI  Physical Exam Triage Vital Signs ED Triage Vitals  Enc Vitals Group     BP      Pulse      Resp      Temp      Temp src      SpO2      Weight      Height      Head Circumference      Peak Flow      Pain Score      Pain Loc      Pain Edu?      Excl. in [place]?    No data found.  Updated Vital Signs BP 119/84 (BP Location: Right Arm)    Pulse (!) 57    Temp 98 F (36.7 C) (Oral)    Resp 16    SpO2  100%   Visual Acuity Right Eye Distance:   Left Eye Distance:   Bilateral Distance:    Right Eye Near:   Left Eye Near:    Bilateral Near:     Physical Exam Constitutional:      General: He is not in acute distress.    Appearance: Normal appearance. He is not ill-appearing.  HENT:     Head: Normocephalic and atraumatic.  Pulmonary:     Effort: Pulmonary effort is normal. No respiratory distress.  Musculoskeletal:        General: No swelling.     Cervical back: Neck supple.     Comments: No boney tenderness of left lower extremity Distal motor and sensory function intact  Able to move foot in all directions and move all toes  Skin:    General: Skin is warm and dry.     Comments: 8 cm linear laceration  across left lower leg that is superficial aside from the middle 1.5 cm that extends below the subcutaneous tissue and would edges are not approximated.  Fascia visualized underneath, no surrounding erythema  Neurological:     General: No focal deficit present.     Mental Status: He is alert and oriented to person, place, and time.  Psychiatric:        Mood and Affect: Mood normal.        Behavior:  Behavior normal.     UC Treatments / Results  Labs (all labs ordered are listed, but only abnormal results are displayed) Labs Reviewed - No data to display  EKG   Radiology No results found.  Procedures Laceration Repair  Date/Time: 09/01/2021 12:48 PM Performed by: Cleophas Dunker, DO Authorized by: Chase Picket, MD   Consent:    Consent obtained:  Verbal   Consent given by:  Patient   Risks, benefits, and alternatives were discussed: yes     Risks discussed:  Infection, pain and poor cosmetic result Universal protocol:    Procedure explained and questions answered to patient or proxy's satisfaction: yes     Site/side marked: yes     Immediately prior to procedure, a time out was called: yes     Patient identity confirmed:  Verbally with patient and arm band Anesthesia:    Anesthesia method:  Local infiltration   Local anesthetic:  Lidocaine 1% WITH epi Laceration details:    Location:  Leg   Leg location:  L lower leg   Length (cm):  1.5   Depth (mm):  2 Pre-procedure details:    Preparation:  Patient was prepped and draped in usual sterile fashion Exploration:    Wound extent: no fascia violation noted, no muscle damage noted, no nerve damage noted, no tendon damage noted, no underlying fracture noted and no vascular damage noted     Contaminated: no   Treatment:    Area cleansed with:  Povidone-iodine   Amount of cleaning:  Standard   Irrigation solution:  Sterile saline   Irrigation volume:  20cc   Irrigation method:  Syringe   Visualized foreign bodies/material removed: no     Debridement:  None   Undermining:  None Skin repair:    Repair method:  Sutures   Suture size:  4-0   Suture material:  Prolene   Suture technique:  Simple interrupted   Number of sutures:  3 Approximation:    Approximation:  Close Repair type:    Repair type:  Simple Post-procedure details:    Dressing:  Non-adherent dressing   Procedure completion:  Tolerated  well, no immediate complications (including critical care time)  Medications Ordered in UC Medications  Tdap (BOOSTRIX) injection 0.5 mL (has no administration in time range)    Initial Impression / Assessment and Plan / UC Course  I have reviewed the triage vital signs and the nursing notes.  Pertinent labs & imaging results that were available during my care of the patient were reviewed by me and considered in my medical decision making (see chart for details).     Lower leg laceration repaired.  Distal motor and sensory function preserved.  Advised that sutures will need removed in about 14 days.  Recommended Follow-up with primary care provider for this or return here.  Advised to keep the area clean and dry, can use warm soapy water over this, but then dry.  Can use a nonadherent dressing over this.  Recommended to avoid putting much tension on the area over the next 1 to 2 weeks.  Given return precautions, see AVS. Tetanus shot given.   Final Clinical Impressions(s) / UC Diagnoses   Final diagnoses:  Laceration of skin of left lower leg without complication, initial encounter  Need for tetanus booster     Discharge Instructions      You received 3 stitches.  This should be removed in 14 days.  You can see your primary care provider or return here for this.  Keep the area clean and dry.  You can wash with warm soapy water, then pat to dry.  Keep a nonadherent dressing over it.  Over the next few days, you can use antibiotic ointment on it as well.  If you have significant worsening of pain, redness, fever, pus, you should be seen by medical provider right away.  He also received a tetanus booster today.     ED Prescriptions   None    PDMP not reviewed this encounter.   Strawn, Bernita Raisin, DO 09/01/21 1254

## 2021-09-01 NOTE — ED Triage Notes (Signed)
Pt reports that he and ladder fell yesterday. Pt reports has laceration to LLE. Pt has wrapped upon triage.

## 2021-09-15 ENCOUNTER — Ambulatory Visit (HOSPITAL_COMMUNITY)
Admission: EM | Admit: 2021-09-15 | Discharge: 2021-09-15 | Disposition: A | Discharge status: Home / Self Care | Payer: No Typology Code available for payment source

## 2021-09-15 ENCOUNTER — Other Ambulatory Visit: Payer: Self-pay

## 2021-09-15 ENCOUNTER — Encounter (HOSPITAL_COMMUNITY): Payer: Self-pay | Admitting: Emergency Medicine

## 2021-09-15 DIAGNOSIS — Z4802 Encounter for removal of sutures: Secondary | ICD-10-CM | POA: Diagnosis not present

## 2021-09-15 DIAGNOSIS — M25512 Pain in left shoulder: Secondary | ICD-10-CM

## 2021-09-15 MED ORDER — MELOXICAM 15 MG PO TABS: 15.0000 mg | ORAL_TABLET | Freq: Every day | ORAL | 0 refills | Status: AC

## 2021-09-15 NOTE — ED Provider Notes (Addendum)
[facility]    CSN: [id number] Arrival date & time: 09/15/21  8185      History   Chief Complaint Chief Complaint  Patient presents with   Suture / Staple Removal   Arm Pain    HPI Juan Golden. is a 61 y.o. male.   Suture removal Patient seen 1/11 after fall from a ladder and had 3 sutures placed in left lower leg He has no concerns and feels like this has healed well  Left Arm Pain Started the day of the Tdap immunization Lateral shoulder pain RHD No injury No swelling, redness, or rash that he noticed Hurts to move his arm Worse at night Has chronic chest pain off and on but this hasn't changed with the shoulder pain, no shortness of breath Has taken ibuprofen with mild improvement Denies prior shoulder pain States he is normally active and works out, but has been resting to try to see if this will help the pain States that a couple days ago he thought that the pain was improving, however it seems to be the same as it was when it first started today    Past Medical History:  Diagnosis Date   Chest pain     Patient Active Problem List   Diagnosis Date Noted   Radiculopathy, cervical region 12/17/2019   Spinal stenosis of cervical region 12/17/2019    Past Surgical History:  Procedure Laterality Date   CARPAL TUNNEL RELEASE     left   TENDON REPAIR     right arm       Home Medications    Prior to Admission medications   Medication Sig Start Date End Date Taking? Authorizing Provider  meloxicam (MOBIC) 15 MG tablet Take 1 tablet (15 mg total) by mouth daily. 09/15/21  Yes Santanna Whitford, Solmon Ice, DO  VITAMIN D PO Take by mouth.   Yes [provider]  atorvastatin (LIPITOR) 20 MG tablet Take 20 mg by mouth daily.    [provider]  Ibuprofen (ADVIL) 200 MG CAPS Take by mouth as needed.    [provider]  NORCO 5-325 MG tablet Take 1 tablet by mouth every 6 (six) hours as needed. 11/07/19   [provider]  ondansetron (ZOFRAN ODT) 4 MG disintegrating tablet Take 1 tablet (4 mg total) by mouth every 8 (eight) hours as needed for nausea or vomiting. 04/04/19   Antony Madura, PA-C  oxyCODONE-acetaminophen (PERCOCET/ROXICET) 5-325 MG tablet Take 1-2 tablets by mouth every 6 (six) hours as needed for severe pain. Patient not taking: Reported on 12/17/2019 04/04/19   Antony Madura, PA-C  tadalafil (CIALIS) 20 MG tablet TAKE 1 TABLET BY MOUTH ONCE DAILY AS NEEDED FOR 30 DAYS 02/11/20   [provider]  tamsulosin (FLOMAX) 0.4 MG CAPS capsule Take 1 capsule (0.4 mg total) by mouth daily. 04/04/19   Antony Madura, PA-C    Family History Family History  Problem Relation Age of Onset   Hypertension Mother    Hypertension Father    Diabetes Father    Dementia Other    Diabetes Other     Social History Social History   Tobacco Use   Smoking status: Never   Smokeless tobacco: Never  Vaping Use   Vaping Use: Never used  Substance Use Topics   Alcohol use: No   Drug use: Never     Allergies   Patient has no known allergies.   Review of Systems Review of Systems  All other systems reviewed and are negative. Per HPI  Physical Exam Triage Vital Signs ED Triage Vitals  Enc Vitals Group     BP      Pulse      Resp      Temp      Temp src      SpO2      Weight      Height      Head Circumference      Peak Flow      Pain Score      Pain Loc      Pain Edu?      Excl. in GC?    No data found.  Updated Vital Signs BP 120/84 (BP Location: Right Arm)    Pulse 64    Temp 98.2 F (36.8 C) (Oral)    Resp 18    SpO2 98%   Visual Acuity Right Eye Distance:   Left Eye Distance:   Bilateral Distance:    Right Eye Near:   Left Eye Near:    Bilateral Near:     Physical Exam Constitutional:      General: He is not in acute distress.    Appearance: Normal appearance. He is not ill-appearing or toxic-appearing.  HENT:     Head: Normocephalic and atraumatic.   Eyes:     Conjunctiva/sclera: Conjunctivae normal.  Cardiovascular:     Rate and Rhythm: Normal rate and regular rhythm.     Heart sounds: No murmur heard.   No friction rub. No gallop.  Pulmonary:     Effort: Pulmonary effort is normal.     Breath sounds: Normal breath sounds. No wheezing, rhonchi or rales.  Musculoskeletal:     Cervical back: Normal range of motion.     Comments: Left Shoulder: Inspection reveals no obvious deformity, atrophy, or asymmetry b/l. No bruising. No swelling Palpation is normal with no TTP over [facility] joint or bicipital groove b/l. Full ROM in flexion, abduction, internal/external rotation b/l, there is pain with extreme of both flexion and abduction NV intact distally b/l Special Tests:  - Impingement: Neg Hawkins - Supraspinatous: Negative empty can - Infraspinatous/Teres Minor: 5/5 strength with ER, some pain with this - Subscapularis: 5/5 strength with IR - Biceps tendon: Negative Speeds - Labrum: Negative Obriens, negative clunk, good stability - AC Joint: Negative cross arm - No drop arm sign    Skin:    General: Skin is warm and dry.     Capillary Refill: Capillary refill takes less than 2 seconds.     Comments: Left lower leg laceration well healing with approximated wound edges and three sutures in place   Neurological:     General: No focal deficit present.     Mental Status: He is alert and oriented to person, place, and time.  Psychiatric:        Mood and Affect: Mood normal.        Behavior: Behavior normal.     UC Treatments / Results  Labs (all labs ordered are listed, but only abnormal results are displayed) Labs Reviewed - No data to display  EKG   Radiology No results found.  Procedures Procedures (including critical care time)  Medications Ordered in UC Medications - No data to display  Initial Impression / Assessment and Plan / UC Course  I have reviewed the triage vital signs and the nursing notes.  Pertinent  labs & imaging results that were available during my care of the  patient were reviewed by me and considered in my medical decision making (see chart for details).     Seems to have more rotator cuff tendinitis than a true complex regional pain syndrome, although could consider this if it does not improve.  No evidence of any vaccine reaction on history.  We will treat with scheduled meloxicam for 5 days, then daily as needed.  Advised to take with food and avoid other NSAIDs while taking.  Recommend follow-up with [facility] medicine if not improving in 1 week.  Sutures removed by nurse, good approximation of wound edges.  Continue to keep clean and dry.  Given ED precautions, see AVS.   Final Clinical Impressions(s) / UC Diagnoses   Final diagnoses:  Acute pain of left shoulder  Visit for suture removal     Discharge Instructions      As we discussed, I have sent a prescription to the pharmacy for a prescription anti-inflammatory.  Take this once a day with food.  Do not take with ibuprofen, advil, or aleve.  If you are not improving over the next week, I recommend that you follow-up with [facility].  Your sutures were removed today.  Ensure that the area remains clean and dry.  This should continue to heal.  If you develop worsening or new chest pain or difficulty breathing, you should be seen at the emergency room right away.     ED Prescriptions     Medication Sig Dispense Auth. Provider   meloxicam (MOBIC) 15 MG tablet Take 1 tablet (15 mg total) by mouth daily. 30 tablet Shyhiem Beeney, Solmon IceBailey J, DO      PDMP not reviewed this encounter.   Ancil Dewan, Solmon IceBailey J, DO 09/15/21 [id number]    Unknown JimMeccariello, Giana Castner J, DO 09/15/21 [phone]

## 2021-09-15 NOTE — ED Triage Notes (Addendum)
Left arm pain that patient relates to the administration of tetanus shot.  Patient reports pain in left upper arm.  Patient was seen on 09/01/2021 for placement of sutures.  Patient particularly has pain in left deltoid at night.  Patient was seen for suture placement on 09/01/2021.  Patient needed sutures due to a fall from ladder.

## 2021-09-15 NOTE — Discharge Instructions (Addendum)
As we discussed, I have sent a prescription to the pharmacy for a prescription anti-inflammatory.  Take this once a day with food.  Do not take with ibuprofen, advil, or aleve.  If you are not improving over the next week, I recommend that you follow-up with [facility].  Your sutures were removed today.  Ensure that the area remains clean and dry.  This should continue to heal.  If you develop worsening or new chest pain or difficulty breathing, you should be seen at the emergency room right away.

## 2021-09-15 NOTE — ED Notes (Signed)
3 sutures removed from left shin, no difficulty.

## 2021-10-12 ENCOUNTER — Other Ambulatory Visit: Payer: Self-pay | Admitting: Family Medicine

## 2022-06-19 IMAGING — DX DG FINGER INDEX 2+V*L*
3 series · 3 of 3 positions shown · non-contrast
Comparison: None.

CLINICAL DATA: Pain at the index finger

EXAM:
LEFT INDEX FINGER 2+V

[finger pa]
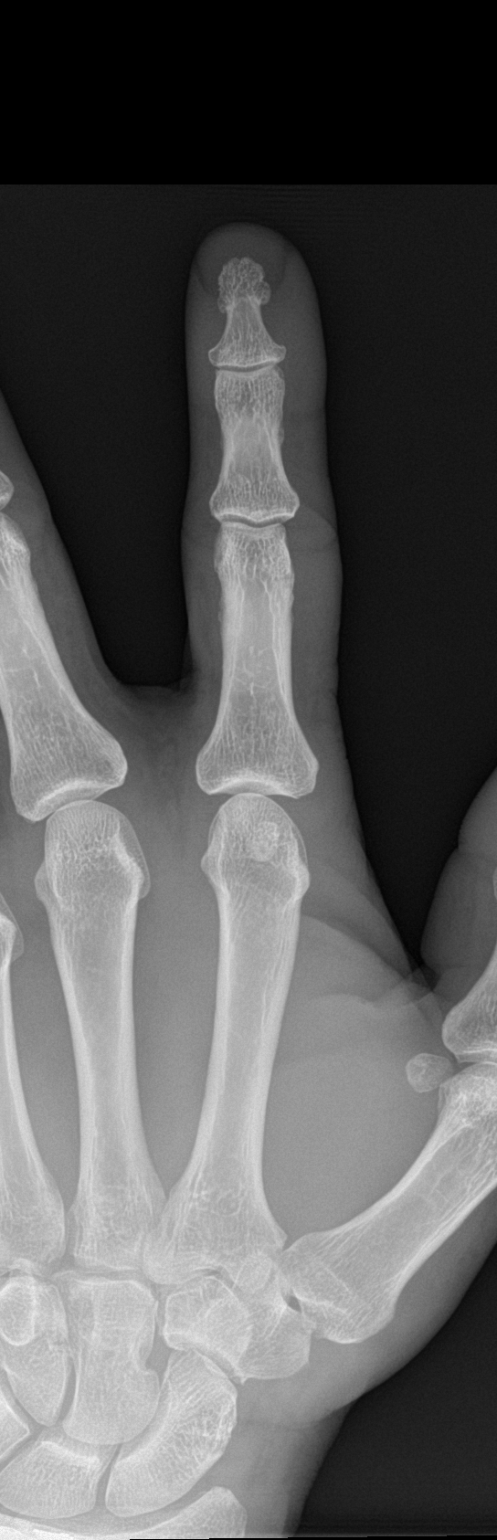

[finger obl]
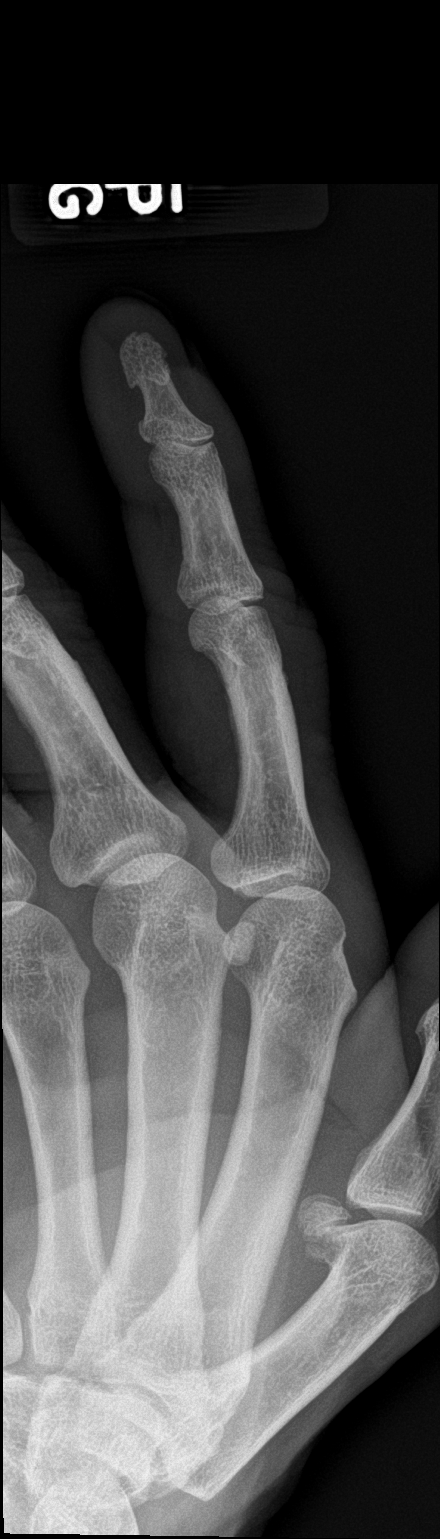

[finger lat]
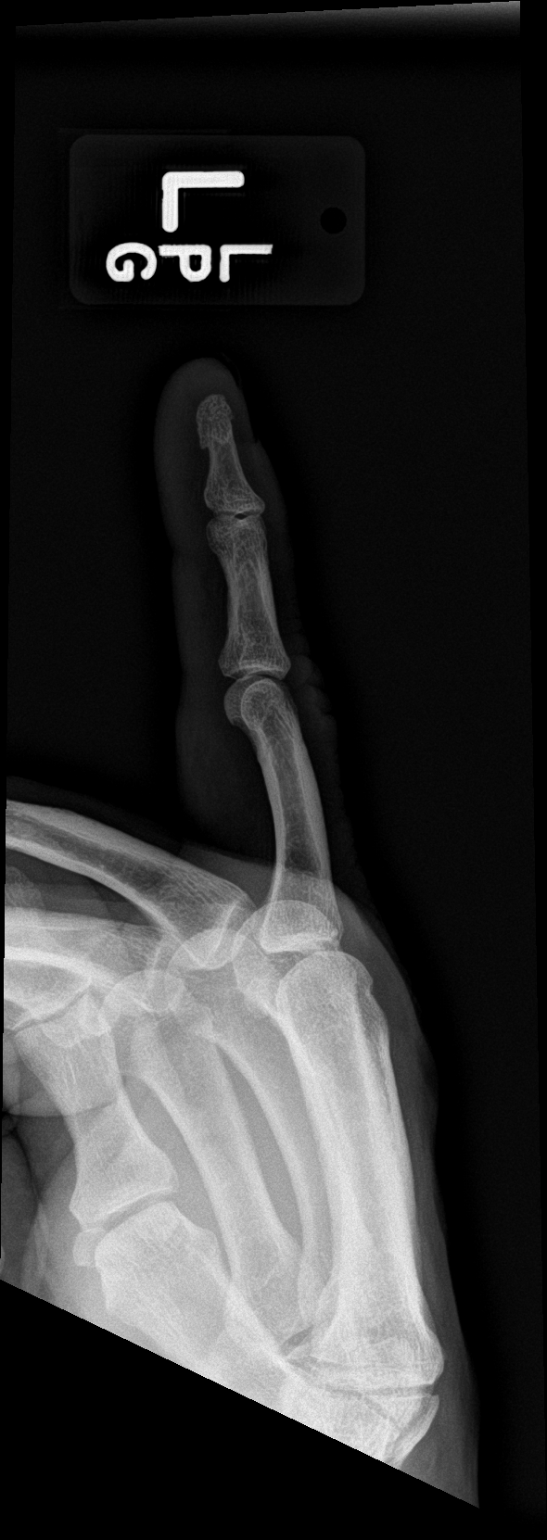

[3 of 3 positions shown; findings below may reference images not displayed]

FINDINGS: There is no evidence of fracture or dislocation. There is no
evidence of arthropathy or other focal bone abnormality. Soft
tissues are unremarkable.
IMPRESSION: Negative.

## 2022-06-19 IMAGING — DX DG TOE GREAT 2+V*R*
3 series · 3 of 3 positions shown · non-contrast
Comparison: None.

CLINICAL DATA: Stone fell on toe

EXAM:
RIGHT GREAT TOE

[dg toe great right (1 of 3)]
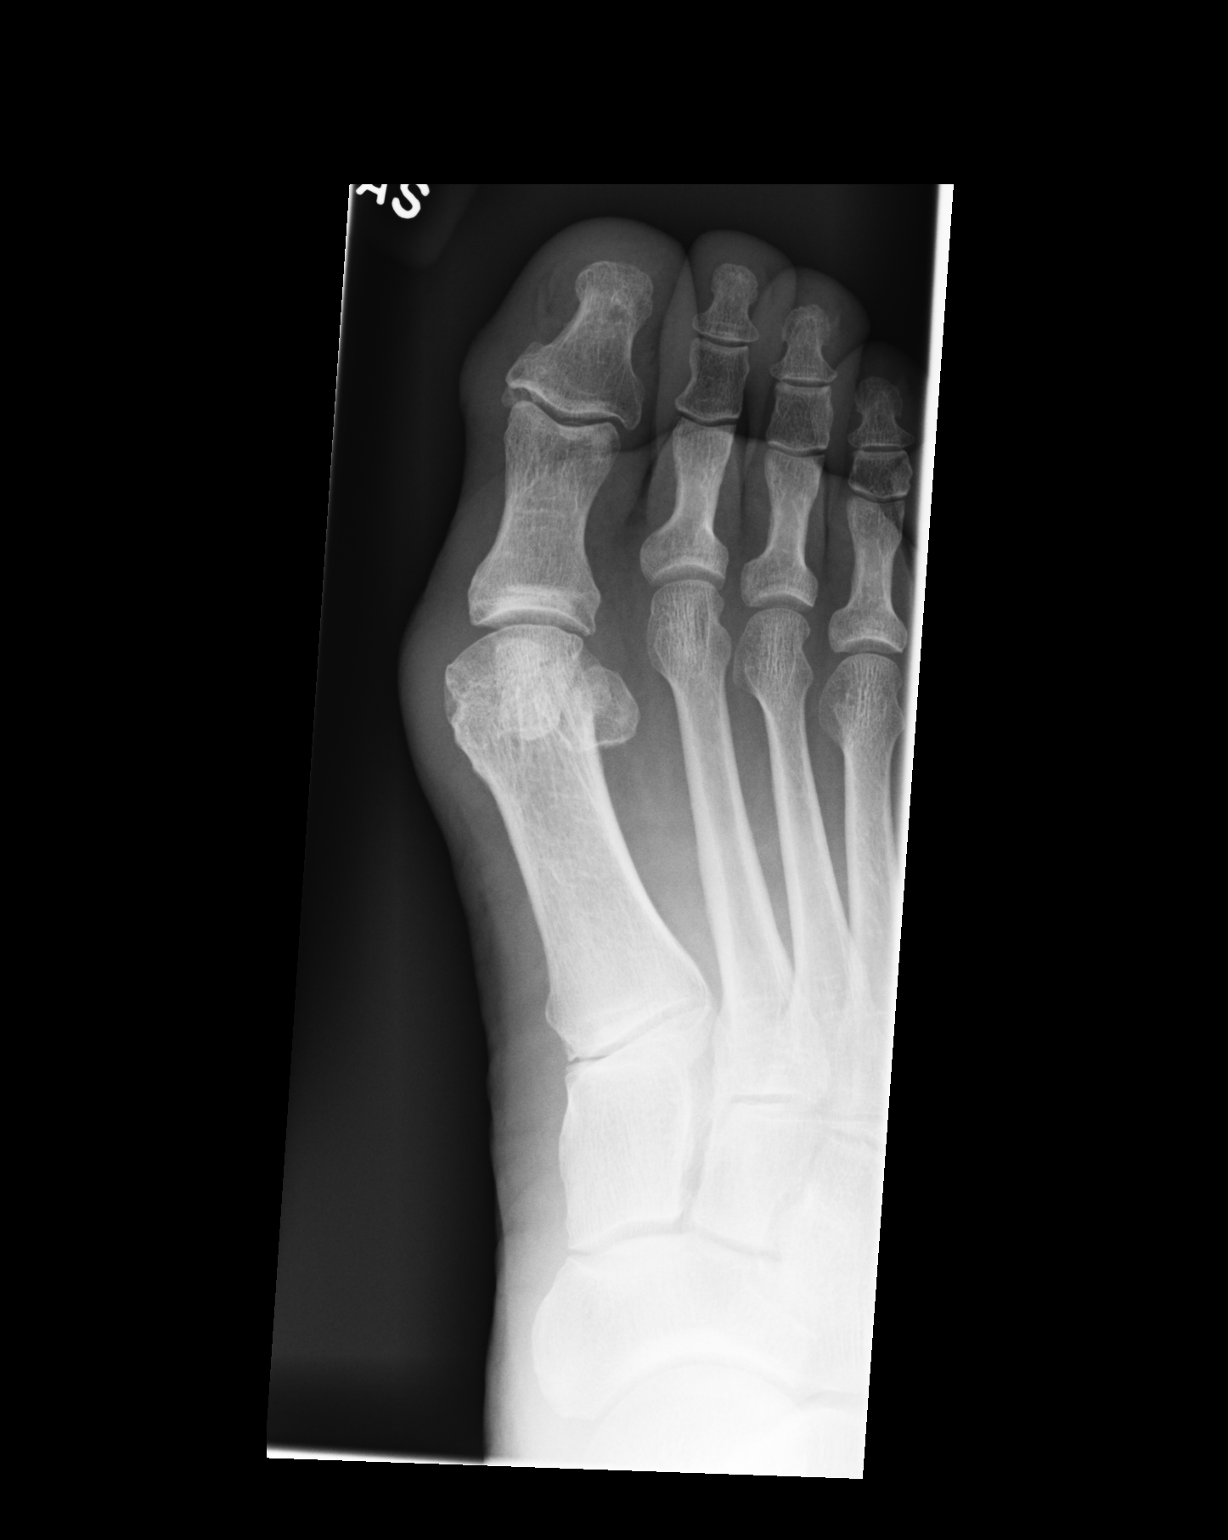

[dg toe great right (2 of 3)]
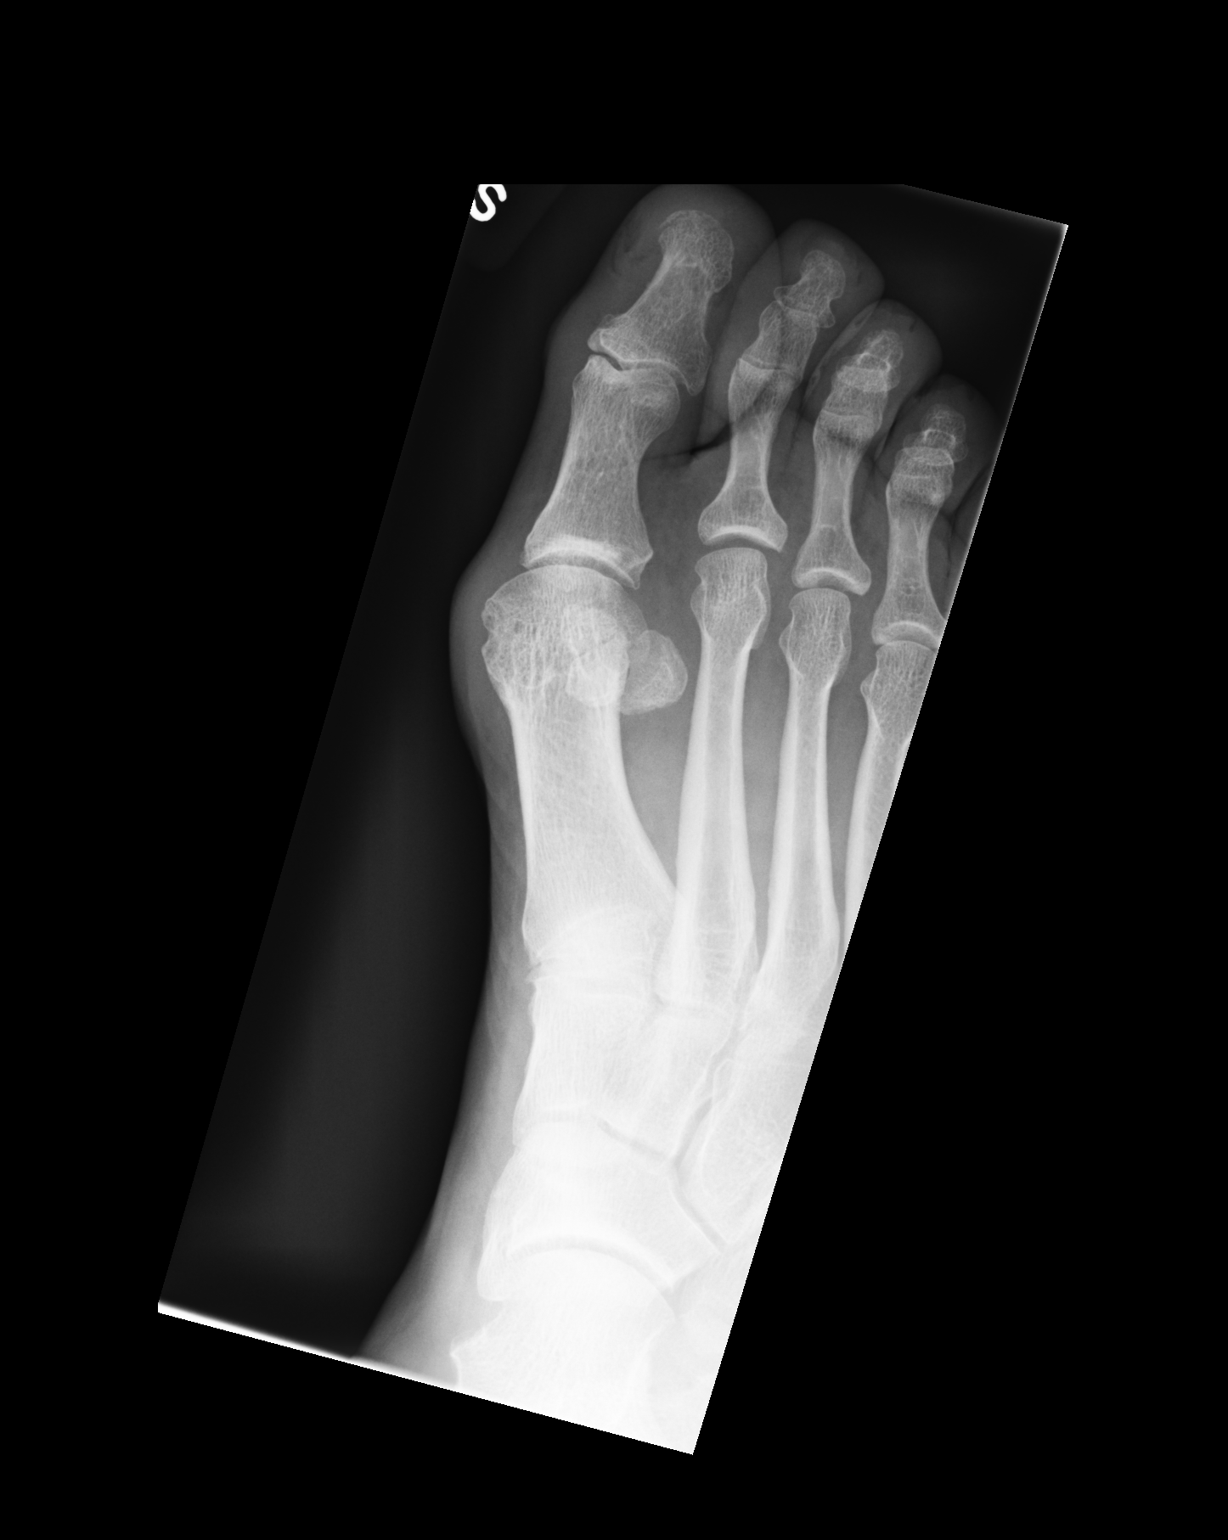

[dg toe great right (3 of 3)]
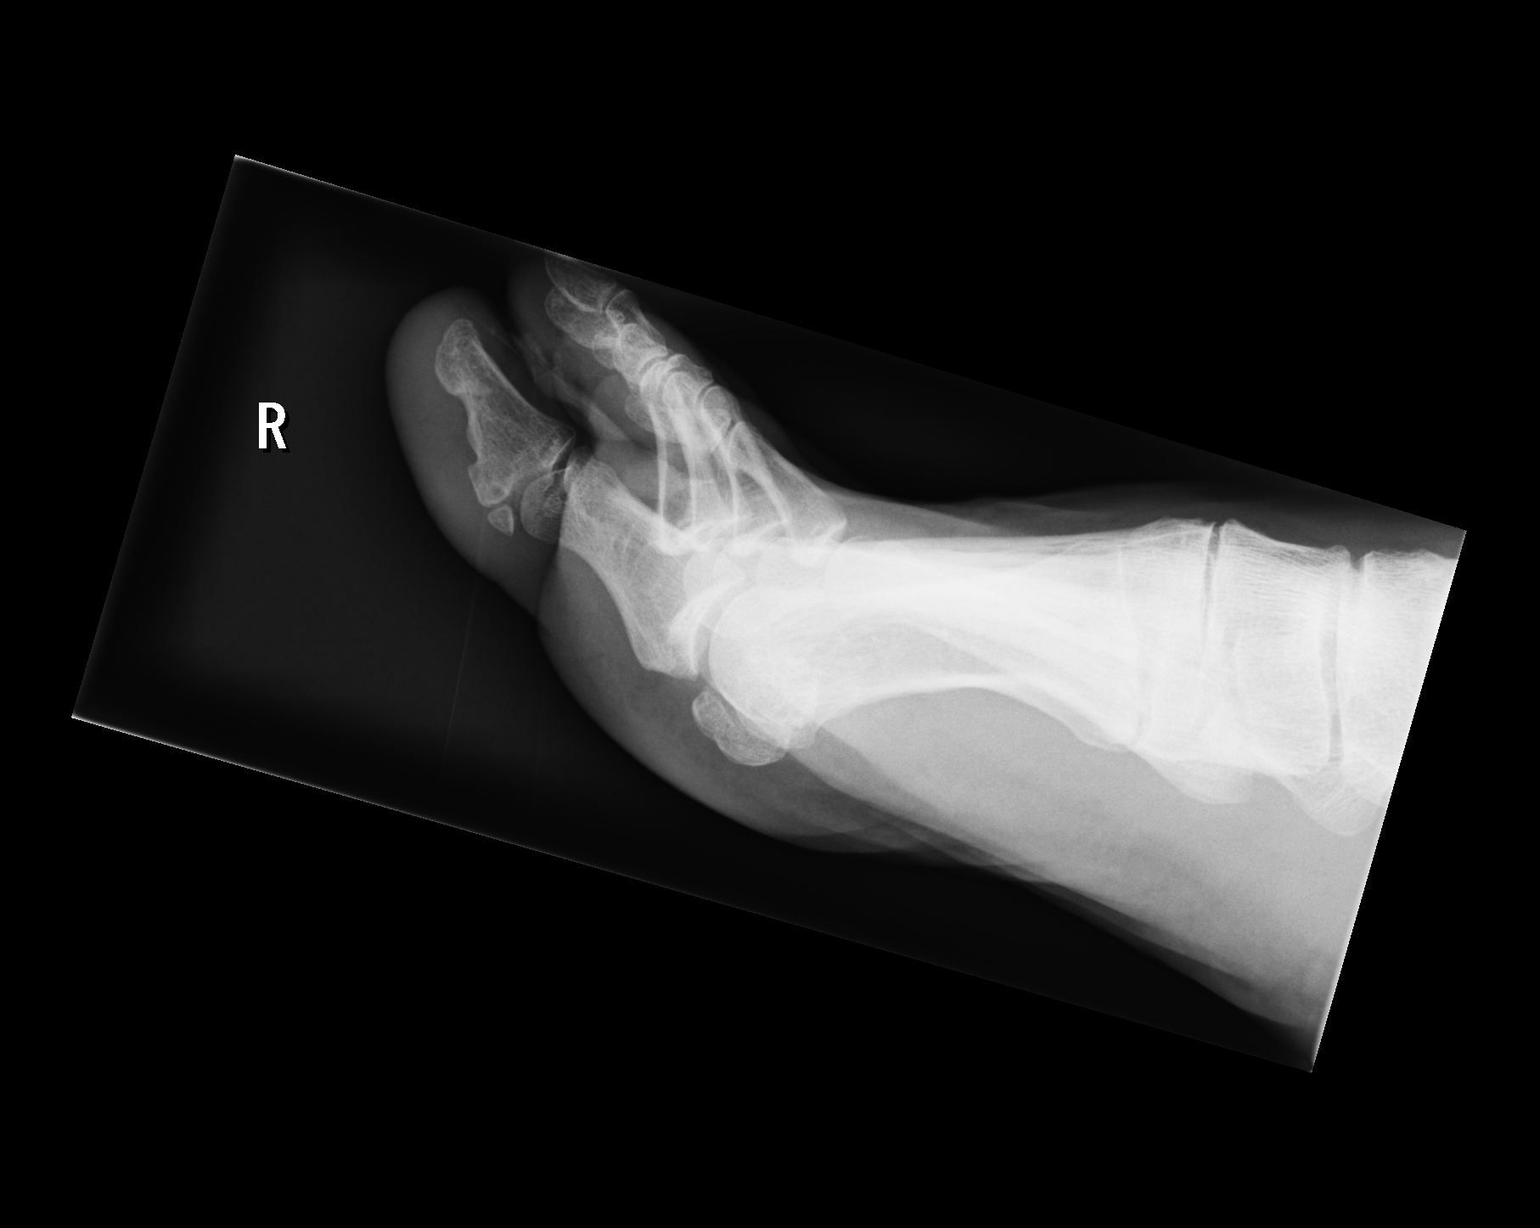

[3 of 3 positions shown; findings below may reference images not displayed]

FINDINGS: Acute nondisplaced fracture at the tip of the tuft of the first
distal phalanx. No subluxation. Mild degenerative change at the
first MTP joint.
IMPRESSION: Acute nondisplaced fracture at the tuft of the first distal phalanx.
